# Patient Record
Sex: Male | Born: 1955 | Hispanic: No | Marital: Married | Smoking: Never smoker
Health system: Southern US, Community
[De-identification: ages and names within clinical notes are randomized; demographics above are authoritative.]

## PROBLEM LIST (undated history)

## (undated) DIAGNOSIS — I370 Nonrheumatic pulmonary valve stenosis: Secondary | ICD-10-CM

## (undated) DIAGNOSIS — I10 Essential (primary) hypertension: Secondary | ICD-10-CM

## (undated) DIAGNOSIS — R0789 Other chest pain: Secondary | ICD-10-CM

## (undated) DIAGNOSIS — Q210 Ventricular septal defect: Secondary | ICD-10-CM

## (undated) HISTORY — DX: Essential (primary) hypertension: I10

## (undated) HISTORY — DX: Ventricular septal defect: Q21.0

## (undated) HISTORY — DX: Nonrheumatic pulmonary valve stenosis: I37.0

## (undated) HISTORY — DX: Other chest pain: R07.89

---

## 2010-08-01 ENCOUNTER — Encounter: Payer: Self-pay | Admitting: Gastroenterology

## 2010-08-25 ENCOUNTER — Encounter (INDEPENDENT_AMBULATORY_CARE_PROVIDER_SITE_OTHER): Payer: Self-pay | Admitting: *Deleted

## 2010-08-29 ENCOUNTER — Ambulatory Visit: Payer: Self-pay | Admitting: Gastroenterology

## 2010-09-11 ENCOUNTER — Ambulatory Visit: Payer: Self-pay | Admitting: Gastroenterology

## 2011-01-23 NOTE — Letter (Signed)
Summary: [facility] Instructions  [facility]  [address] [place], [place] [address]   Phone: [phone]  Fax: [phone]       Roy Moyer    1956-08-02    MRN: [id number]        Procedure Day /Date:  Monday   09-11-10     Arrival Time: 7:30 a.m.     Procedure Time: 8:30 a.m.     Location of Procedure:                    _x_  [facility] (4th Floor)                       PREPARATION FOR COLONOSCOPY WITH MOVIPREP   Starting 5 days prior to your procedure  09-06-10 do not eat nuts, seeds, popcorn, corn, beans, peas,  salads, or any raw vegetables.  Do not take any fiber supplements (e.g. Metamucil, Citrucel, and Benefiber).  THE DAY BEFORE YOUR PROCEDURE         DATE:  09-10-10  DAY: Sunday  1.  Drink clear liquids the entire day-NO SOLID FOOD  2.  Do not drink anything colored red or purple.  Avoid juices with pulp.  No orange juice.  3.  Drink at least 64 oz. (8 glasses) of fluid/clear liquids during the day to prevent dehydration and help the prep work efficiently.  CLEAR LIQUIDS INCLUDE: Water Jello Ice Popsicles Tea (sugar ok, no milk/cream) Powdered fruit flavored drinks Coffee (sugar ok, no milk/cream) Gatorade Juice: apple, white grape, white cranberry  Lemonade Clear bullion, consomm, broth Carbonated beverages (any kind) Strained chicken noodle soup Hard Candy                             4.  In the morning, mix first dose of MoviPrep solution:    Empty 1 Pouch A and 1 Pouch B into the disposable container    Add lukewarm drinking water to the top line of the container. Mix to dissolve    Refrigerate (mixed solution should be used within 24 hrs)  5.  Begin drinking the prep at 5:00 p.m. The MoviPrep container is divided by 4 marks.   Every 15 minutes drink the solution down to the next mark (approximately 8 oz) until the full liter is complete.   6.  Follow completed prep with 16 oz of clear liquid of your choice  (Nothing red or purple).  Continue to drink clear liquids until bedtime.  7.  Before going to bed, mix second dose of MoviPrep solution:    Empty 1 Pouch A and 1 Pouch B into the disposable container    Add lukewarm drinking water to the top line of the container. Mix to dissolve    Refrigerate  THE DAY OF YOUR PROCEDURE      DATE:  09-11-10  DAY: Monday  Beginning at  3:30 a.m. (5 hours before procedure):         1. Every 15 minutes, drink the solution down to the next mark (approx 8 oz) until the full liter is complete.  2. Follow completed prep with 16 oz. of clear liquid of your choice.    3. You may drink clear liquids until  6:30 a.m. (2 HOURS BEFORE PROCEDURE).   MEDICATION INSTRUCTIONS  Unless otherwise instructed, you should take regular prescription medications with a small sip of water  as early as possible the morning of your procedure.        OTHER INSTRUCTIONS  You will need a responsible adult at least 53 years of age to accompany you and drive you home.   This person must remain in the waiting room during your procedure.  Wear loose fitting clothing that is easily removed.  Leave jewelry and other valuables at home.  However, you may wish to bring a book to read or  an iPod/MP3 player to listen to music as you wait for your procedure to start.  Remove all body piercing jewelry and leave at home.  Total time from sign-in until discharge is approximately 2-3 hours.  You should go home directly after your procedure and rest.  You can resume normal activities the  day after your procedure.  The day of your procedure you should not:   Drive   Make legal decisions   Operate machinery   Drink alcohol   Return to work  You will receive specific instructions about eating, activities and medications before you leave.    The above instructions have been reviewed and explained to me by   Wyona Almas RN  August 29, 2010 3:55 PM     I fully  understand and can verbalize these instructions _____________________________ Date _________

## 2011-01-23 NOTE — Procedures (Signed)
Summary: Colonoscopy  Patient: Roy Moyer Note: All result statuses are Final unless otherwise noted.  Tests: (1) Colonoscopy (COL)   COL Colonoscopy           DONE     [facility]     [address]     [place], [place]  [address]           COLONOSCOPY PROCEDURE REPORT           PATIENT:  [name], [name]  MR#:  [id number]     BIRTHDATE:  [birth year], 54 yrs. old  GENDER:  male     ENDOSCOPIST:  Vania Rea. Jarold Motto, MD, [facility]     REF. BY:  Talbot Grumbling. Creta Levin, M.D.     PROCEDURE DATE:  09/11/2010     PROCEDURE:  Average-risk screening colonoscopy     G0121     ASA CLASS:  Class I     INDICATIONS:  Routine Risk Screening     MEDICATIONS:   Fentanyl 100 mcg IV, Versed 10 mcg IV           DESCRIPTION OF PROCEDURE:   After the risks benefits and     alternatives of the procedure were thoroughly explained, informed     consent was obtained.  Digital rectal exam was performed and     revealed no abnormalities.   The LB CF-[id number] endoscope     was introduced through the anus and advanced to the cecum, which     was identified by both the appendix and ileocecal valve, without     limitations.  The quality of the prep was excellent, using     MoviPrep.  The instrument was then slowly withdrawn as the colon     was fully examined.     <<PROCEDUREIMAGES>>           FINDINGS:  Moderate diverticulosis was found throughout the colon.     No polyps or cancers were seen.  This was otherwise a normal     examination of the colon.   Retroflexed views in the rectum     revealed hypertrophied anal papillae.    The scope was then     withdrawn from the patient and the procedure completed.           COMPLICATIONS:  None     ENDOSCOPIC IMPRESSION:     1) Moderate diverticulosis throughout the colon     2) No polyps or cancers     3) Otherwise normal examination     4) Hypertrophied anal papillae     RECOMMENDATIONS:     1) high fiber diet     2) Continue current colorectal  screening recommendations for     "routine risk" patients with a repeat colonoscopy in 10 years.     REPEAT EXAM:  No           ______________________________     Vania Rea. Jarold Motto, MD, [facility]           CC:           n.     eSIGNED:   Vania Rea. Patterson at 09/11/2010 08:55 AM           Tommie Ard, [id number]  Note: An exclamation mark (!) indicates a result that was not dispersed into the flowsheet. Document Creation Date: 09/11/2010 8:55 AM _______________________________________________________________________  (1) Order result status: Final Collection or observation date-time: 09/11/2010 08:50 Requested date-time:  Receipt date-time:  Reported date-time:  Referring Physician:   Ordering Physician: Sheryn Bison [phone]) Specimen Source:  Source: Launa Grill Order Number: [id number] Lab site:   Appended Document: Colonoscopy    Clinical Lists Changes  Observations: Added new observation of COLONNXTDUE: 08/2020 (09/11/2010 10:55)

## 2011-01-23 NOTE — Miscellaneous (Signed)
Summary: [facility] Previsit/prep  Clinical Lists Changes  Medications: Added new medication of MOVIPREP 100 GM  SOLR (PEG-KCL-NACL-NASULF-NA ASC-C) As per prep instructions. - Signed Rx of MOVIPREP 100 GM  SOLR (PEG-KCL-NACL-NASULF-NA ASC-C) As per prep instructions.;  #1 x 0;  Signed;  Entered by: Wyona Almas RN;  Authorized by: Mardella Layman MD [facility];  Method used: Print then Give to Patient Observations: Added new observation of NKA: T (08/29/2010 15:31)    Prescriptions: MOVIPREP 100 GM  SOLR (PEG-KCL-NACL-NASULF-NA ASC-C) As per prep instructions.  #1 x 0   Entered by:   Wyona Almas RN   Authorized by:   Mardella Layman MD [facility]   Signed by:   Wyona Almas RN on 08/29/2010   Method used:   Print then Give to Patient   RxID:   [id number]

## 2011-01-23 NOTE — Letter (Signed)
Summary: Previsit letter  [facility]  [address] [place], [place] [address]   Phone: [phone]  Fax: [phone]       08/01/2010 MRN: [id number]  Roy Moyer [address] [place], [place]  [address]  Dear [name],  Welcome to the Gastroenterology Division at [facility].    You are scheduled to see a nurse for your pre-procedure visit on 08-29-10 at 3:30pm on the 3rd floor at [facility], [address]  We ask that you try to arrive at our office 15 minutes prior to your appointment time to allow for check-in.  Your nurse visit will consist of discussing your medical and surgical history, your immediate family medical history, and your medications.    Please bring a complete list of all your medications or, if you prefer, bring the medication bottles and we will list them.  We will need to be aware of both prescribed and over the counter drugs.  We will need to know exact dosage information as well.  If you are on blood thinners (Coumadin, Plavix, Aggrenox, Ticlid, etc.) please call our office today/prior to your appointment, as we need to consult with your physician about holding your medication.   Please be prepared to read and sign documents such as consent forms, a financial agreement, and acknowledgement forms.  If necessary, and with your consent, a friend or relative is welcome to sit-in on the nurse visit with you.  Please bring your insurance card so that we may make a copy of it.  If your insurance requires a referral to see a specialist, please bring your referral form from your primary care physician.  No co-pay is required for this nurse visit.     If you cannot keep your appointment, please call [phone] to cancel or reschedule prior to your appointment date.  This allows [place] the opportunity to schedule an appointment for another patient in need of care.    Thank you for choosing [facility] for your medical needs.  We  appreciate the opportunity to care for you.  Please visit [place] at our website  to learn more about our practice.                     Sincerely.                                                                                                                   The Gastroenterology Division

## 2011-09-07 ENCOUNTER — Encounter: Payer: Self-pay | Admitting: *Deleted

## 2011-09-07 DIAGNOSIS — IMO0002 Reserved for concepts with insufficient information to code with codable children: Secondary | ICD-10-CM | POA: Insufficient documentation

## 2011-09-07 DIAGNOSIS — S0180XA Unspecified open wound of other part of head, initial encounter: Secondary | ICD-10-CM | POA: Insufficient documentation

## 2011-09-07 DIAGNOSIS — Y92838 Other recreation area as the place of occurrence of the external cause: Secondary | ICD-10-CM | POA: Insufficient documentation

## 2011-09-07 DIAGNOSIS — Y9239 Other specified sports and athletic area as the place of occurrence of the external cause: Secondary | ICD-10-CM | POA: Insufficient documentation

## 2011-09-07 DIAGNOSIS — Y9317 Activity, water skiing and wake boarding: Secondary | ICD-10-CM | POA: Insufficient documentation

## 2011-09-07 DIAGNOSIS — I10 Essential (primary) hypertension: Secondary | ICD-10-CM | POA: Insufficient documentation

## 2011-09-07 NOTE — ED Notes (Signed)
Wake boarding and hit his face on the board. Laceration to his chin. Teeth intact.

## 2011-09-08 ENCOUNTER — Emergency Department (HOSPITAL_BASED_OUTPATIENT_CLINIC_OR_DEPARTMENT_OTHER)
Admission: EM | Admit: 2011-09-08 | Discharge: 2011-09-08 | Disposition: A | Discharge status: Home / Self Care | Payer: Managed Care, Other (non HMO) | Attending: Emergency Medicine | Admitting: Emergency Medicine

## 2011-09-08 DIAGNOSIS — S0181XA Laceration without foreign body of other part of head, initial encounter: Secondary | ICD-10-CM

## 2011-09-08 HISTORY — DX: Essential (primary) hypertension: I10

## 2011-09-08 MED ORDER — CEPHALEXIN 500 MG PO CAPS: 500.0000 mg | ORAL_CAPSULE | Freq: Four times a day (QID) | ORAL | Status: AC

## 2011-09-08 MED ORDER — TETANUS-DIPHTH-ACELL PERTUSSIS 5-2.5-18.5 LF-MCG/0.5 IM SUSP: 0.5000 mL | Freq: Once | INTRAMUSCULAR | Status: DC

## 2011-09-08 MED ORDER — IBUPROFEN 800 MG PO TABS: 800.0000 mg | ORAL_TABLET | Freq: Three times a day (TID) | ORAL | Status: AC

## 2011-09-08 MED ORDER — BACITRACIN ZINC 500 UNIT/GM EX OINT
TOPICAL_OINTMENT | Freq: Once | CUTANEOUS | Status: AC
  Administered 2011-09-08: 02:00:00 via TOPICAL
  Filled 2011-09-08: qty 15

## 2011-09-08 NOTE — ED Provider Notes (Signed)
History     CSN: [id number] Arrival date & time: 09/08/2011 12:27 AM   Chief Complaint  Patient presents with  . Facial Laceration     (Include location/radiation/quality/duration/timing/severity/associated sxs/prior treatment) Patient is a 55 y.o. male presenting with skin laceration. The history is provided by the patient.  Laceration  The incident occurred 3 to 5 hours ago. The laceration is located on the face. The laceration is 4 cm in size. Injury mechanism: wake boarding and board struck him in the face, no LOC or neck pain, small chip to L lat incisor - no loose teeth. The pain is moderate. The pain has been constant since onset. He reports no foreign bodies present. His tetanus status is UTD.     Past Medical History  Diagnosis Date  . Hypertension      History reviewed. No pertinent past surgical history.  No family history on file.  History  Substance Use Topics  . Smoking status: Never Smoker   . Smokeless tobacco: Not on file  . Alcohol Use: Yes      Review of Systems  Constitutional: Negative for fever and chills.  HENT: Negative for facial swelling, neck pain and neck stiffness.   Eyes: Negative for pain.  Respiratory: Negative for shortness of breath.   Cardiovascular: Negative for chest pain and leg swelling.  Gastrointestinal: Negative for abdominal pain.  Genitourinary: Negative for dysuria.  Musculoskeletal: Negative for back pain.  Skin: Positive for wound. Negative for rash.  Neurological: Negative for headaches.  All other systems reviewed and are negative.    Allergies  Review of patient's allergies indicates not on file.  Home Medications  No current outpatient prescriptions on file.  Physical Exam    BP 120/62  Pulse 84  Temp(Src) 98.1 F (36.7 C) (Oral)  Resp 20  SpO2 98%  Physical Exam  Constitutional: He is oriented to person, place, and time. He appears well-developed and well-nourished.  HENT:  Head: Normocephalic.         1cm inner lowwer lip lac no lower dental trauma, very samll ellis I fx to L upper lat incisor no loose or tender dentition. There is associated chim lac Y shaped 3cm thru and thru full thickness lac, no bony deformity  Eyes: Conjunctivae and EOM are normal. Pupils are equal, round, and reactive to light.  Neck: Normal range of motion and full passive range of motion without pain. Neck supple. No thyromegaly present.       No midline tenderness or deformity  Cardiovascular: Normal rate, regular rhythm, S1 normal, S2 normal and intact distal pulses.   Pulmonary/Chest: Effort normal and breath sounds normal.  Abdominal: Soft. Bowel sounds are normal. There is no tenderness. There is no CVA tenderness.  Musculoskeletal: Normal range of motion.  Neurological: He is alert and oriented to person, place, and time. He has normal strength and normal reflexes. No cranial nerve deficit or sensory deficit. He displays a negative Romberg sign. GCS eye subscore is 4. GCS verbal subscore is 5. GCS motor subscore is 6.       Normal Gait  Skin: Skin is warm and dry. No rash noted. No cyanosis. Nails show no clubbing.  Psychiatric: He has a normal mood and affect. His speech is normal and behavior is normal.    ED Course  LACERATION REPAIR Date/Time: 09/08/2011 1:58 AM Performed by: Sunnie Nielsen Authorized by: Sunnie Nielsen Consent: Verbal consent obtained. Risks and benefits: risks, benefits and alternatives were discussed Consent given by:  patient Patient understanding: patient states understanding of the procedure being performed Patient consent: the patient's understanding of the procedure matches consent given Procedure consent: procedure consent matches procedure scheduled Patient identity confirmed: verbally with patient Time out: Immediately prior to procedure a "time out" was called to verify the correct patient, procedure, equipment, support staff and site/side marked as required. Location:  chin and inner lower lip. Laceration length: 4 cm Foreign bodies: no foreign bodies Tendon involvement: none Nerve involvement: none Vascular damage: no Anesthesia: local infiltration Local anesthetic: lidocaine 1% without epinephrine Anesthetic total: 3 ml Preparation: Patient was prepped and draped in the usual sterile fashion. Irrigation solution: saline Irrigation method: syringe Amount of cleaning: extensive Debridement: none Degree of undermining: none Skin closure: 6-0 Prolene Subcutaneous closure: 5-0 Vicryl Number of sutures: 5 Technique: simple Approximation: close Approximation difficulty: complex Dressing: 4x4 sterile gauze and antibiotic ointment Patient tolerance: Patient tolerated the procedure well with no immediate complications.    No results found for this or any previous visit. No results found.   No diagnosis found.   MDM  Facial lac thru and thru with minor dental trauma. No LOC or neck pain/ defictis. RX, tetanus and SR 5 days       Sunnie Nielsen, MD 09/08/11 0200

## 2014-12-08 ENCOUNTER — Other Ambulatory Visit: Payer: Self-pay | Admitting: Family Medicine

## 2014-12-08 DIAGNOSIS — K4090 Unilateral inguinal hernia, without obstruction or gangrene, not specified as recurrent: Secondary | ICD-10-CM

## 2014-12-10 ENCOUNTER — Ambulatory Visit
Admission: RE | Admit: 2014-12-10 | Discharge: 2014-12-10 | Disposition: A | Discharge status: Home / Self Care | Payer: Managed Care, Other (non HMO) | Source: Ambulatory Visit | Attending: Family Medicine | Admitting: Family Medicine

## 2014-12-10 DIAGNOSIS — K4090 Unilateral inguinal hernia, without obstruction or gangrene, not specified as recurrent: Secondary | ICD-10-CM

## 2014-12-28 ENCOUNTER — Ambulatory Visit (INDEPENDENT_AMBULATORY_CARE_PROVIDER_SITE_OTHER): Payer: Self-pay | Admitting: Surgery

## 2014-12-28 NOTE — H&P (Signed)
Roy Moyer 12/28/2014 1:53 PM Location: [facility] Patient #: [id number] DOB: 11/13/1956 Married / Language: [place] / Race: White Male History of Present Illness Roy Moyer Fus A. Kaily Wragg MD; 12/28/2014 2:29 PM) Patient words: RIH  pt sent at the request of Dr Doristine Counter for right inguinal hernia. He has has swelling for years right goin with mild discomfort with activity but better with rest. He has had a hernia on the left repaired before.  The patient is a 59 year old male who presents with inguinal swelling. The last clinic visit was 12 month(s) ago. Symptoms include inguinal swelling and inguinal pain. Onset was gradual. The patient describes this as worsening. Other Problems [name], CMA; 12/28/2014 1:53 PM) Heart murmur High blood pressure Hypercholesterolemia Inguinal Hernia  Past Surgical History [name], CMA; 12/28/2014 1:53 PM) Open Inguinal Hernia Surgery Left.  Diagnostic Studies History [name], CMA; 12/28/2014 1:53 PM) Colonoscopy 1-5 years ago  Allergies [name], CMA; 12/28/2014 1:54 PM) No Known Drug Allergies 12/28/2014  Medication History [name], CMA; 12/28/2014 1:54 PM) Lipitor (  Tablet, Oral) Active.  Social History [name], [place]; 12/28/2014 1:53 PM) Alcohol use Occasional alcohol use. Caffeine use Coffee, Tea. No drug use Tobacco use Never smoker.  Family History [name], [place]; 12/28/2014 1:53 PM) Hypertension Mother.     Review of Systems [name] CMA; 12/28/2014 1:53 PM) General Not Present- Appetite Loss, Chills, Fatigue, Fever, Night Sweats, Weight Gain and Weight Loss. Skin Not Present- Change in Wart/Mole, Dryness, Hives, Jaundice, New Lesions, Non-Healing Wounds, Rash and Ulcer. HEENT Present- Ringing in the Ears and Wears glasses/contact lenses. Not Present- Earache, Hearing Loss, Hoarseness, Nose Bleed, Oral Ulcers, Seasonal Allergies, Sinus Pain, Sore Throat, Visual  Disturbances and Yellow Eyes. Respiratory Present- Snoring. Not Present- Bloody sputum, Chronic Cough, Difficulty Breathing and Wheezing. Breast Not Present- Breast Mass, Breast Pain, Nipple Discharge and Skin Changes. Cardiovascular Not Present- Chest Pain, Difficulty Breathing Lying Down, Leg Cramps, Palpitations, Rapid Heart Rate, Shortness of Breath and Swelling of Extremities. Gastrointestinal Not Present- Abdominal Pain, Bloating, Bloody Stool, Change in Bowel Habits, Chronic diarrhea, Constipation, Difficulty Swallowing, Excessive gas, Gets full quickly at meals, Hemorrhoids, Indigestion, Nausea, Rectal Pain and Vomiting. Male Genitourinary Not Present- Blood in Urine, Change in Urinary Stream, Frequency, Impotence, Nocturia, Painful Urination, Urgency and Urine Leakage. Musculoskeletal Not Present- Back Pain, Joint Pain, Joint Stiffness, Muscle Pain, Muscle Weakness and Swelling of Extremities. Neurological Not Present- Decreased Memory, Fainting, Headaches, Numbness, Seizures, Tingling, Tremor, Trouble walking and Weakness. Psychiatric Not Present- Anxiety, Bipolar, Change in Sleep Pattern, Depression, Fearful and Frequent crying. Endocrine Not Present- Cold Intolerance, Excessive Hunger, Hair Changes, Heat Intolerance, Hot flashes and New Diabetes. Hematology Not Present- Easy Bruising, Excessive bleeding, Gland problems, HIV and Persistent Infections.  Vitals [name] CMA; 12/28/2014 1:55 PM) 12/28/2014 1:55 PM Weight: 156 lb Height: 66in Body Surface Area: 1.82 m Body Mass Index: 25.18 kg/m Temp.: 98.58F  Pulse: 74 (Regular)  BP: 140/90 (Sitting, Left Arm, Standard)     Physical Exam ([name] MD; 12/28/2014 2:29 PM)  General Mental Status-Alert. General Appearance-Consistent with stated age. Hydration-Well hydrated. Voice-Normal.  Head and Neck Head-normocephalic, atraumatic with no lesions or palpable  masses. Trachea-midline.  Eye Eyeball - Bilateral-Extraocular movements intact. Sclera/Conjunctiva - Bilateral-No scleral icterus.  Chest and Lung Exam Chest and lung exam reveals -quiet, even and easy respiratory effort with no use of accessory muscles and on auscultation, normal breath sounds, no adventitious sounds and normal vocal resonance. Inspection Chest Wall -  Normal. Back - normal.  Cardiovascular Cardiovascular examination reveals -normal heart sounds, regular rate and rhythm with no murmurs and normal pedal pulses bilaterally.  Abdomen Inspection Skin - Scar - no surgical scars. Hernias - Ventral - Reducible. Inguinal hernia - Left - Note: scar no recurrent hernia. Inguinal hernia - Right - Reducible. Palpation/Percussion Palpation and Percussion of the abdomen reveal - Soft, Non Tender, No Rebound tenderness, No Rigidity (guarding) and No hepatosplenomegaly. Auscultation Auscultation of the abdomen reveals - Bowel sounds normal.  Neurologic Neurologic evaluation reveals -alert and oriented x 3 with no impairment of recent or remote memory. Mental Status-Normal.  Musculoskeletal Normal Exam - Left-Upper Extremity Strength Normal and Lower Extremity Strength Normal. Normal Exam - Right-Upper Extremity Strength Normal, Lower Extremity Weakness.    Assessment & Plan ([name] MD; 12/28/2014 2:25 PM)  RIGHT INGUINAL HERNIA (550.90  K40.90) Impression: pt desires repair. open and laparoscopic discussed with mesh. pt decided on open right inguinal hernia repair with mesh. The risk of hernia repair include bleeding, infection, organ injury, bowel injury, bladder injury, nerve injury recurrent hernia, blood clots, worsening of underlying condition, chronic pain, mesh use, open surgery, death, and the need for other operattions. Pt agrees to proceed  Current Plans Pt Education - [facility] Umbilical/ Inguinal Hernia HCI

## 2015-05-25 ENCOUNTER — Encounter: Payer: Self-pay | Admitting: Gastroenterology

## 2016-05-28 DIAGNOSIS — E785 Hyperlipidemia, unspecified: Secondary | ICD-10-CM | POA: Insufficient documentation

## 2016-06-14 ENCOUNTER — Ambulatory Visit (INDEPENDENT_AMBULATORY_CARE_PROVIDER_SITE_OTHER): Payer: Managed Care, Other (non HMO) | Admitting: Cardiovascular Disease

## 2016-06-14 ENCOUNTER — Encounter: Payer: Self-pay | Admitting: Cardiovascular Disease

## 2016-06-14 VITALS — BP 131/78 | HR 75 | Ht 67.0 in | Wt 164.8 lb

## 2016-06-14 DIAGNOSIS — E785 Hyperlipidemia, unspecified: Secondary | ICD-10-CM | POA: Diagnosis not present

## 2016-06-14 DIAGNOSIS — I370 Nonrheumatic pulmonary valve stenosis: Secondary | ICD-10-CM

## 2016-06-14 DIAGNOSIS — Q210 Ventricular septal defect: Secondary | ICD-10-CM | POA: Diagnosis not present

## 2016-06-14 NOTE — Patient Instructions (Addendum)
Medication Instructions:  Your physician recommends that you continue on your current medications as directed. Please refer to the Current Medication list given to you today.  Labwork: NONE  Testing/Procedures: Your physician has requested that you have an echocardiogram. Echocardiography is a painless test that uses sound waves to create images of your heart. It provides your doctor with information about the size and shape of your heart and how well your heart's chambers and valves are working. This procedure takes approximately one hour. There are no restrictions for this procedure. [facility] [address]  Follow-Up: Your physician recommends that you schedule a follow-up appointment in: 2-4 WEEK FOLLOW UP (AFTER ECHO  If you need a refill on your cardiac medications before your next appointment, please call your pharmacy.

## 2016-06-14 NOTE — Progress Notes (Signed)
Cardiology Office Note   Date:  06/14/2016   ID:  Roy Moyer, DOB 14-Nov-1956, MRN [id number]  PCP:  Lilia ArgueKAPLAN,KRISTEN, PA-C  Cardiologist:   Chilton Siiffany [place], MD   Chief Complaint  Patient presents with  . New Patient (Initial Visit)    Pt states no Sx      History of Present Illness: Roy Moyer is a 60 y.o. male with hypertension, pulmonary stenosis, and hyperlipidemia who presents for evaluation of a murmur.  Roy Moyer reports being diagnosed with pulmonary stenosis as an infant.  He thinks that he also has a left to right shunt.  He had a heart catheterization but never required any surgery or interventions.  He last had an echo four years ago and was told that he no longer had pulmonary stenosis.  Roy Moyer saw Mady GemmaKristen Kaplan, Cordelia PochePA-C, of [facility] on 05/28/16.  At that appointment he was noted to have a loud murmur and was referred to cardiology for evaluation.    Roy Moyer has been feeling well.  He denies chest pain, shortness of breath, lower extremity edema, orthopnea or PND.  He exercises for 30 minute 3 times per week doing resistance training and walking.  He notes that his blood pressure is intermittently elevated.  Four years ago he was treated with Benicar but he became hypotensive. He reduced the dose and eventually stopped it.  The highest it has been at home lately is 140/90.  He was prescribed atorvastatin four years ago.  He is now taking 5mg  instead of the 10mg  prescribed and he does not take it regularly.  He has been working to improve his diet.  Past Medical History  Diagnosis Date  . Hypertension     No past surgical history on file.   Current Outpatient Prescriptions  Medication Sig Dispense Refill  . atorvastatin (LIPITOR) 10 MG tablet Take 5 mg by mouth daily.     No current facility-administered medications for this visit.    Allergies:   Review of patient's allergies indicates not on file.    Social History:  The patient   reports that he has never smoked. He does not have any smokeless tobacco history on file. He reports that he drinks alcohol. He reports that he does not use illicit drugs.   Family History:  The patient's family history is not on file.    ROS:  Please see the history of present illness.   Otherwise, review of systems are positive for none.   All other systems are reviewed and negative.    PHYSICAL EXAM: VS:  BP 131/78 mmHg  Pulse 75  Ht 5\' 7"  (1.702 m)  Wt 164 lb 12.8 oz (74.753 kg)  BMI 25.81 kg/m2 , BMI Body mass index is 25.81 kg/(m^2). GENERAL:  Well appearing HEENT:  Pupils equal round and reactive, fundi not visualized, oral mucosa unremarkable NECK:  No jugular venous distention, waveform within normal limits, carotid upstroke brisk and symmetric, no bruits, no thyromegaly LYMPHATICS:  No cervical adenopathy LUNGS:  Clear to auscultation bilaterally HEART:  RRR.  PMI not displaced or sustained,S1 and S2 within normal limits, no S3, no S4, no clicks, no rubs, III/VI mid-peaking systolic murmur at the LUSB.  IV/VI harsh, systolic murmur at the LLSB. ABD:  Flat, positive bowel sounds normal in frequency in pitch, no bruits, no rebound, no guarding, no midline pulsatile mass, no hepatomegaly, no splenomegaly EXT:  2 plus pulses throughout, no edema, no cyanosis no clubbing SKIN:  No rashes no nodules NEURO:  Cranial nerves II through XII grossly intact, motor grossly intact throughout PSYCH:  Cognitively intact, oriented to person place and time    EKG:  EKG is not ordered today. The ekg ordered 319/14 demonstrates sinus rhythm rate 72 bpm.     Recent Labs: No results found for requested labs within last 365 days.    Lipid Panel No results found for: CHOL, TRIG, HDL, CHOLHDL, VLDL, LDLCALC, LDLDIRECT   6/5/786/2/17:  TSH 2.29  Total cholesterol 198, triglycerides 80, HDL 55, LDL 119 Sodium 143, potassium 4.3, BUN 15, creatinine 0.85 AST 19, ALT 28 WBC 6.6, Hgb 14.6, Hct  42.7, platelets 207   Wt Readings from Last 3 Encounters:  06/14/16 164 lb 12.8 oz (74.753 kg)      ASSESSMENT AND PLAN:  # Pulmonary Stenosis: # VSD:  Roy Moyer has murmurs consistent with both pulmonary stenosis and a VSD.  He is stable clinically and does not have any evidence of left or right heart failure.  We will obtain an echo to evaluate.  I'm especially interested in the size and function of the right ventricle.  Given how loud his VSD murmur is, the whole is likely very small.   # Hypertension: BP is well-controlled.  Continue to monitor for now.  # Hyperlipidemia: Lipids are elevated and he is only taking the prescribed dose.  We discussed the importance of taking it regularly.  He expressed understanding.   Current medicines are reviewed at length with the patient today.  The patient does not have concerns regarding medicines.  The following changes have been made:  no change  Labs/ tests ordered today include:  No orders of the defined types were placed in this encounter.     Disposition:   FU with Delma Drone C. Duke Salviaandolph, MD, [facility] in 2-4 weeks   This note was written with the assistance of speech recognition software.  Please excuse any transcriptional errors.  Signed, Crislyn Willbanks C. Duke Salviaandolph, MD, [facility]  06/14/2016 12:17 PM    [facility]

## 2016-06-17 ENCOUNTER — Encounter: Payer: Self-pay | Admitting: Cardiovascular Disease

## 2016-06-17 DIAGNOSIS — Q210 Ventricular septal defect: Secondary | ICD-10-CM | POA: Insufficient documentation

## 2016-06-17 DIAGNOSIS — I370 Nonrheumatic pulmonary valve stenosis: Secondary | ICD-10-CM

## 2016-06-17 HISTORY — DX: Nonrheumatic pulmonary valve stenosis: I37.0

## 2016-06-17 HISTORY — DX: Ventricular septal defect: Q21.0

## 2016-07-03 ENCOUNTER — Other Ambulatory Visit: Payer: Self-pay

## 2016-07-03 ENCOUNTER — Ambulatory Visit (HOSPITAL_COMMUNITY): Payer: Managed Care, Other (non HMO) | Attending: Cardiology

## 2016-07-03 DIAGNOSIS — Q210 Ventricular septal defect: Secondary | ICD-10-CM | POA: Insufficient documentation

## 2016-07-03 DIAGNOSIS — I119 Hypertensive heart disease without heart failure: Secondary | ICD-10-CM | POA: Diagnosis not present

## 2016-07-03 DIAGNOSIS — I370 Nonrheumatic pulmonary valve stenosis: Secondary | ICD-10-CM | POA: Diagnosis present

## 2016-07-09 ENCOUNTER — Telehealth: Payer: Self-pay | Admitting: Cardiovascular Disease

## 2016-07-09 NOTE — Telephone Encounter (Signed)
Follow-up     The pt is returning the nurses call from Friday

## 2016-07-09 NOTE — Telephone Encounter (Signed)
Left message to call back  

## 2016-07-09 NOTE — Telephone Encounter (Signed)
-----   Message from Chilton Siiffany Oakesdale, MD sent at 07/06/2016 10:19 AM EDT ----- Echo shows a small VSD, or hole in the wall between the left and right ventricle. His echo is stable and no changes recommended at this time.

## 2016-07-10 NOTE — Telephone Encounter (Signed)
Advised patient

## 2016-07-10 NOTE — Telephone Encounter (Signed)
F/U ° ° °Pt returning nurse call. Please call. °

## 2016-07-11 NOTE — Progress Notes (Signed)
Cardiology Office Note   Date:  07/12/2016   ID:  Roy Moyer, DOB 1956-02-07, MRN [id number]  PCP:  Lilia Argue  Cardiologist:   Chilton Si, MD   Chief Complaint  Patient presents with  . Follow-up      History of Present Illness: Roy Moyer is a 60 y.o. male with hypertension, pulmonary stenosis, and hyperlipidemia who presents for evaluation of a murmur.  Roy Moyer reports being diagnosed with pulmonary stenosis as an infant.  He thinks that he also has a left to right shunt.  He had a heart catheterization but never required any surgery or interventions.  He last had an echo four years ago and was told that he no longer had pulmonary stenosis.  He had an echo 06/2016 that revealed a small VSD with normal systolic function and left ventricular size.  Roy Moyer continues to do well.  He denies chest pain or shortness of breath.  He also has not noted any lower extremity edema, orthopnea or PND.  He hasn't been doing much cardio but does weight training regularly and has no limitations.  He rarely checks his BP at home.  It is typically in the 130s/70s.    Past Medical History  Diagnosis Date  . Hypertension   . Pulmonary stenosis 06/17/2016  . VSD (ventricular septal defect) 06/17/2016    No past surgical history on file.   Current Outpatient Prescriptions  Medication Sig Dispense Refill  . atorvastatin (LIPITOR) 10 MG tablet Take 5 mg by mouth daily.     No current facility-administered medications for this visit.    Allergies:   Review of patient's allergies indicates not on file.    Social History:  The patient  reports that he has never smoked. He does not have any smokeless tobacco history on file. He reports that he drinks alcohol. He reports that he does not use illicit drugs.   Family History:  The patient's family history includes Hypertension in his father and mother.    ROS:  Please see the history of present illness.   Otherwise,  review of systems are positive for none.   All other systems are reviewed and negative.    PHYSICAL EXAM: VS:  BP 130/76 mmHg  Pulse 58  Ht  (1.702 m)  Wt 165 lb (74.844 kg)  BMI 25.84 kg/m2  SpO2 99% , BMI Body mass index is 25.84 kg/(m^2). GENERAL:  Well appearing HEENT:  Pupils equal round and reactive, fundi not visualized, oral mucosa unremarkable NECK:  No jugular venous distention, waveform within normal limits, carotid upstroke brisk and symmetric, no bruits, no thyromegaly LYMPHATICS:  No cervical adenopathy LUNGS:  Clear to auscultation bilaterally HEART:  RRR.  PMI not displaced or sustained,S1 and S2 within normal limits, no S3, no S4, no clicks, no rubs, IV/VI harsh, systolic murmur at the LLSB. ABD:  Flat, positive bowel sounds normal in frequency in pitch, no bruits, no rebound, no guarding, no midline pulsatile mass, no hepatomegaly, no splenomegaly EXT:  2 plus pulses throughout, no edema, no cyanosis no clubbing SKIN:  No rashes no nodules NEURO:  Cranial nerves II through XII grossly intact, motor grossly intact throughout PSYCH:  Cognitively intact, oriented to person place and time   EKG:  EKG is not ordered today. The ekg ordered 319/14 demonstrates sinus rhythm rate 72 bpm.    Echo 07/03/16: History: PMH: Hyperlipidemia. Risk factors: Hypertension.  ------------------------------------------------------------------- Study Conclusions  - Left ventricle: The cavity size  was normal. Wall thickness was  normal. Systolic function was normal. The estimated ejection  fraction was in the range of 60% to 65%. Wall motion was normal;  there were no regional wall motion abnormalities. - Right atrium: The atrium was mildly dilated.  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  There is no pulmonic stenosis .   Recent Labs: No results found for requested labs within last 365 days.      Lipid Panel No results found for: CHOL, TRIG, HDL, CHOLHDL, VLDL, LDLCALC, LDLDIRECT   0/9/816/2/17:  TSH 2.29  Total cholesterol 198, triglycerides 80, HDL 55, LDL 119 Sodium 143, potassium 4.3, BUN 15, creatinine 0.85 AST 19, ALT 28 WBC 6.6, Hgb 14.6, Hct 42.7, platelets 207   Wt Readings from Last 3 Encounters:  07/12/16 165 lb (74.844 kg)  06/14/16 164 lb 12.8 oz (74.753 kg)      ASSESSMENT AND PLAN:  # Pulmonary Stenosis: # VSD:  Roy Moyer did not have any pulmonary stenosis on his last two echos.  He never required intervention and this likely resolved as he got older.  He still has a small VSD but there is no hemodynamic consequence at this time.  There was no evidence of LV overload or systolic dysfunction.  Endocarditis prophylaxis is not indicated.  # Hypertension: BP is well-controlled.  He was previously on losartan and developed hypotension.  Continue to monitor for now.  # Hyperlipidemia: Lipids are elevated and he is only taking half the prescribed dose on the week days and none on the weekend.  We discussed the importance of compliance and recommended increased cardio.  Current medicines are reviewed at length with the patient today.  The patient does not have concerns regarding medicines.  The following changes have been made:  no change  Labs/ tests ordered today include:  No orders of the defined types were placed in this encounter.     Disposition:   FU with Roy Hantz C. Duke Salviaandolph, MD, [facility] in 1 year.   This note was written with the assistance of speech recognition software.  Please excuse any transcriptional errors.  Signed, Yula Crotwell C. Duke Salviaandolph, MD, [facility]  07/12/2016 7:59 AM    [facility]

## 2016-07-12 ENCOUNTER — Ambulatory Visit (INDEPENDENT_AMBULATORY_CARE_PROVIDER_SITE_OTHER): Payer: Managed Care, Other (non HMO) | Admitting: Cardiovascular Disease

## 2016-07-12 ENCOUNTER — Encounter: Payer: Self-pay | Admitting: Cardiovascular Disease

## 2016-07-12 VITALS — BP 130/76 | HR 58 | Ht 67.0 in | Wt 165.0 lb

## 2016-07-12 DIAGNOSIS — Q210 Ventricular septal defect: Secondary | ICD-10-CM

## 2016-07-12 DIAGNOSIS — E785 Hyperlipidemia, unspecified: Secondary | ICD-10-CM

## 2016-07-12 NOTE — Patient Instructions (Signed)
Your physician wants you to follow-up in: 1 Year. You will receive a reminder letter in the mail two months in advance. If you don't receive a letter, please call our office to schedule the follow-up appointment.  

## 2016-07-23 ENCOUNTER — Ambulatory Visit: Payer: Managed Care, Other (non HMO) | Admitting: Cardiovascular Disease

## 2017-07-23 ENCOUNTER — Ambulatory Visit: Payer: Managed Care, Other (non HMO) | Admitting: Cardiovascular Disease

## 2017-11-06 ENCOUNTER — Telehealth: Payer: Self-pay | Admitting: *Deleted

## 2017-11-06 NOTE — Telephone Encounter (Signed)
Appointment request notes faxed to [facility]. Geselle Cardosa, CMA 

## 2017-12-11 ENCOUNTER — Encounter: Payer: Self-pay | Admitting: Cardiovascular Disease

## 2017-12-11 ENCOUNTER — Ambulatory Visit (INDEPENDENT_AMBULATORY_CARE_PROVIDER_SITE_OTHER): Payer: Commercial Managed Care - PPO | Admitting: Cardiovascular Disease

## 2017-12-11 VITALS — BP 148/78 | HR 64 | Ht 67.0 in | Wt 152.4 lb

## 2017-12-11 DIAGNOSIS — Q210 Ventricular septal defect: Secondary | ICD-10-CM | POA: Diagnosis not present

## 2017-12-11 DIAGNOSIS — I10 Essential (primary) hypertension: Secondary | ICD-10-CM

## 2017-12-11 DIAGNOSIS — E7800 Pure hypercholesterolemia, unspecified: Secondary | ICD-10-CM

## 2017-12-11 NOTE — Patient Instructions (Signed)
Medication Instructions:  Your physician recommends that you continue on your current medications as directed. Please refer to the Current Medication list given to you today.  Labwork: NONE  Testing/Procedures: NONE  Follow-Up: Your physician recommends that you schedule a follow-up appointment in: END OF February   Any Other Special Instructions Will Be Listed Below (If Applicable). MONITOR YOUR BLOOD PRESSURE AT HOME DAILY AND BRING WITH YOU TO YOUR FOLLOW UP   If you need a refill on your cardiac medications before your next appointment, please call your pharmacy.

## 2017-12-11 NOTE — Progress Notes (Signed)
Cardiology Office Note   Date:  12/11/2017   ID:  Tommie ArdBruce Horsey, DOB Feb 16, 1956, MRN [id number]  PCP:  Richmond CampbellKaplan, Kristen W., PA-C  Cardiologist:   Chilton Siiffany De Lamere, MD   No chief complaint on file.     History of Present Illness: Tommie ArdBruce Albea is a 61 y.o. male with a small VSD, hypertension, and hyperlipidemia who presents for evaluation of a murmur.  [name] reports being diagnosed with pulmonary stenosis as an infant.  He had a heart catheterization but never required any surgery or interventions.  He last had an echo four years ago and was told that he no longer had pulmonary stenosis.  He had an echo 06/2016 that revealed a small VSD with normal systolic function and left ventricular size.  There was no pulmonic stenosis.  At his last appointment he was doing well.  He was only taking his cholesterol medicine intermittently and was asked to take it more regularly due to elevated LDL.  Since that appointment he worked with his PCP and decided to stop taking the atorvastatin on a trial basis.  He is scheduled to have repeat lipids with his primary care provider in mid February.  He has been walking for 30-35 minutes daily.  He also does lots of yard work.  He has no exertional symptoms.  He has no lower extremity edema, orthopnea, or PND.  His blood pressure at home is usually around 130-135 over 80s.  He does not have much salt in his diet and eats most of his meals at home.   Past Medical History:  Diagnosis Date  . Hypertension   . Pulmonary stenosis 06/17/2016  . VSD (ventricular septal defect) 06/17/2016    History reviewed. No pertinent surgical history.   No current outpatient medications on file.   No current facility-administered medications for this visit.     Allergies:   Patient has no allergy information on record.    Social History:  The patient  reports that  has never smoked. he has never used smokeless tobacco. He reports that he drinks alcohol. He reports  that he does not use drugs.   Family History:  The patient's family history includes Hypertension in his father and mother.    ROS:  Please see the history of present illness.   Otherwise, review of systems are positive for none.   All other systems are reviewed and negative.    PHYSICAL EXAM: VS:  BP (!) 148/78   Pulse 64   Ht 5\' 7"  (1.702 m)   Wt 152 lb 6.4 oz (69.1 kg)   BMI 23.87 kg/m  , BMI Body mass index is 23.87 kg/m. GENERAL:  Well appearing HEENT: Pupils equal round and reactive, fundi not visualized, oral mucosa unremarkable NECK:  No jugular venous distention, waveform within normal limits, carotid upstroke brisk and symmetric, no bruits, no thyromegaly LYMPHATICS:  No cervical adenopathy LUNGS:  Clear to auscultation bilaterally HEART:  RRR.  PMI not displaced or sustained,S1 and S2 within normal limits, no S3, no S4, no clicks, no rubs, III/VI harsh systolic murmur at the lower sternal border ABD:  Flat, positive bowel sounds normal in frequency in pitch, no bruits, no rebound, no guarding, no midline pulsatile mass, no hepatomegaly, no splenomegaly EXT:  2 plus pulses throughout, no edema, no cyanosis no clubbing SKIN:  No rashes no nodules NEURO:  Cranial nerves II through XII grossly intact, motor grossly intact throughout PSYCH:  Cognitively intact, oriented to person place  and time    EKG:  EKG is not ordered today. The ekg ordered 319/14 demonstrates sinus rhythm rate 72 bpm.   12/11/17: Sinus rhythm.  Rate 64 bpm.    Echo 07/03/16: History: PMH: Hyperlipidemia. Risk factors: Hypertension.  ------------------------------------------------------------------- Study Conclusions  - Left ventricle: The cavity size was normal. Wall thickness was  normal. Systolic function was normal. The estimated ejection  fraction was in the range of 60% to 65%. Wall motion was normal;  there were no regional wall motion abnormalities. - Right atrium: The atrium  was mildly dilated.  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  There is no pulmonic stenosis .   Recent Labs: No results found for requested labs within last 8760 hours.    Lipid Panel No results found for: CHOL, TRIG, HDL, CHOLHDL, VLDL, LDLCALC, LDLDIRECT   0/9/816/2/17:  TSH 2.29  Total cholesterol 198, triglycerides 80, HDL 55, LDL 119 Sodium 143, potassium 4.3, BUN 15, creatinine 0.85 AST 19, ALT 28 WBC 6.6, Hgb 14.6, Hct 42.7, platelets 207   Wt Readings from Last 3 Encounters:  12/11/17 152 lb 6.4 oz (69.1 kg)  07/12/16 165 lb (74.8 kg)  06/14/16 164 lb 12.8 oz (74.8 kg)      ASSESSMENT AND PLAN:  # Pulmonary Stenosis: # VSD:  [name] did not have any pulmonary stenosis on his last two echos.  He never required intervention and this likely resolved as he got older.  He still has a small VSD but there is no hemodynamic consequence at this time.  There was no evidence of LV overload or systolic dysfunction.  Endocarditis prophylaxis is not indicated.  Stable.   # Hypertension: BP is borderline.  He was previously on losartan and developed hypotension.  He would like to try and increase his exercise.  He will keep a log of his blood pressures at home and bring that to follow-up in 2 months.  # Hyperlipidemia: Atorvastatin is currently on hold.  He is due for repeat lipids in February.  At that time we will recheck his ASCVD 10-year risk and reassess.  Current medicines are reviewed at length with the patient today.  The patient does not have concerns regarding medicines.  The following changes have been made:  no change  Labs/ tests ordered today include:  No orders of the defined types were placed in this encounter.    Disposition:   FU with Tino Ronan C. Duke Salviaandolph, MD, [facility] in 2 months.   This note was written with the assistance of speech recognition software.  Please excuse any transcriptional  errors.  Signed, Doren Kaspar C. Duke Salviaandolph, MD, [facility]  12/11/2017 9:57 AM    [facility]

## 2017-12-11 NOTE — Addendum Note (Signed)
Addended by: Chana BodeGREEN, Elizabethanne Lusher L on: 12/11/2017 10:06 AM   Modules accepted: Orders

## 2018-02-19 ENCOUNTER — Encounter: Payer: Self-pay | Admitting: Cardiovascular Disease

## 2018-02-19 ENCOUNTER — Ambulatory Visit (INDEPENDENT_AMBULATORY_CARE_PROVIDER_SITE_OTHER): Payer: Commercial Managed Care - PPO | Admitting: Cardiovascular Disease

## 2018-02-19 VITALS — BP 136/84 | HR 78 | Ht 67.0 in | Wt 154.8 lb

## 2018-02-19 DIAGNOSIS — Q210 Ventricular septal defect: Secondary | ICD-10-CM | POA: Diagnosis not present

## 2018-02-19 DIAGNOSIS — E7800 Pure hypercholesterolemia, unspecified: Secondary | ICD-10-CM

## 2018-02-19 NOTE — Patient Instructions (Addendum)
Medication Instructions:  START ASPIRIN 81 MG DAILY   Labwork: none  Testing/Procedures: none  Follow-Up: Your physician wants you to follow-up in: 1 year ov  You will receive a reminder letter in the mail two months in advance. If you don't receive a letter, please call our office to schedule the follow-up appointment.  If you need a refill on your cardiac medications before your next appointment, please call your pharmacy.

## 2018-02-19 NOTE — Progress Notes (Signed)
Cardiology Office Note   Date:  02/19/2018   ID:  Roy ArdBruce Grenz, DOB 1956-12-17, MRN [id number]  PCP:  Richmond CampbellKaplan, Kristen W., PA-C  Cardiologist:   Chilton Siiffany Sikeston, MD   Chief Complaint  Patient presents with  . Follow-up     History of Present Illness: Roy Moyer is a 62 y.o. male with a small VSD, hypertension, and hyperlipidemia who presents for follow up.  [name] reports being diagnosed with pulmonary stenosis as an infant.  He had a heart catheterization but never required any surgery or interventions.  He last had an echo four years ago and was told that he no longer had pulmonary stenosis.  He had an echo 06/2016 that revealed a small VSD with normal systolic function and left ventricular size.  There was no pulmonic stenosis.  At his last appointment his blood pressure was elevated.  However he wanted to work on diet and exercise before starting any medication.  In the past he felt lightheaded and dizzy when taking losartan.  He brings a log of his blood pressures that show that at home it has been mostly in the 110s to low 130s.  He has started to exercise more regularly.  He works out 2 or 3 times per week either on the elliptical or treadmill.  He exercises for 20-30 minutes each time.  He has no chest pain or shortness of breath with activity.  He also has not noted any lower extremity edema, orthopnea, or PND.  He had labs with his PCP recently that showed that his  HDL had dropped and his LDL was elevated again.  He reports that his diet has been pretty good.  He rarely eats red meat and limits fried foods and fatty foods.  He has mostly poultry or fish.   Past Medical History:  Diagnosis Date  . Hypertension   . Pulmonary stenosis 06/17/2016  . VSD (ventricular septal defect) 06/17/2016    History reviewed. No pertinent surgical history.   Current Outpatient Medications  Medication Sig Dispense Refill  . aspirin EC 81 MG tablet Take 81 mg by mouth daily.     No  current facility-administered medications for this visit.     Allergies:   Patient has no allergy information on record.    Social History:  The patient  reports that  has never smoked. he has never used smokeless tobacco. He reports that he drinks alcohol. He reports that he does not use drugs.   Family History:  The patient's family history includes Hypertension in his father and mother.    ROS:  Please see the history of present illness.   Otherwise, review of systems are positive for none.   All other systems are reviewed and negative.    PHYSICAL EXAM: VS:  BP 136/84   Pulse 78   Ht 5\' 7"  (1.702 m)   Wt 154 lb 12.8 oz (70.2 kg)   BMI 24.25 kg/m  , BMI Body mass index is 24.25 kg/m. GENERAL:  Well appearing HEENT: Pupils equal round and reactive, fundi not visualized, oral mucosa unremarkable NECK:  No jugular venous distention, waveform within normal limits, carotid upstroke brisk and symmetric, no bruits LUNGS:  Clear to auscultation bilaterally HEART:  RRR.  PMI not displaced or sustained,S1 and S2 within normal limits, no S3, no S4, no clicks, no rubs, no murmurs ABD:  Flat, positive bowel sounds normal in frequency in pitch, no bruits, no rebound, no guarding, no midline pulsatile  mass, no hepatomegaly, no splenomegaly EXT:  2 plus pulses throughout, no edema, no cyanosis no clubbing SKIN:  No rashes no nodules NEURO:  Cranial nerves II through XII grossly intact, motor grossly intact throughout PSYCH:  Cognitively intact, oriented to person place and time   EKG:  EKG is not ordered today. The ekg ordered 319/14 demonstrates sinus rhythm rate 72 bpm.   12/11/17: Sinus rhythm.  Rate 64 bpm.    Echo 07/03/16: History: PMH: Hyperlipidemia. Risk factors: Hypertension.  ------------------------------------------------------------------- Study Conclusions  - Left ventricle: The cavity size was normal. Wall thickness was  normal. Systolic function was normal. The  estimated ejection  fraction was in the range of 60% to 65%. Wall motion was normal;  there were no regional wall motion abnormalities. - Right atrium: The atrium was mildly dilated.  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  There is no pulmonic stenosis .   Recent Labs: No results found for requested labs within last 8760 hours.    Lipid Panel No results found for: CHOL, TRIG, HDL, CHOLHDL, VLDL, LDLCALC, LDLDIRECT   04/30/83:  TSH 2.29  Total cholesterol 198, triglycerides 80, HDL 55, LDL 119 Sodium 143, potassium 4.3, BUN 15, creatinine 0.85 AST 19, ALT 28 WBC 6.6, Hgb 14.6, Hct 42.7, platelets 207   Wt Readings from Last 3 Encounters:  02/19/18 154 lb 12.8 oz (70.2 kg)  12/11/17 152 lb 6.4 oz (69.1 kg)  07/12/16 165 lb (74.8 kg)      ASSESSMENT AND PLAN:  # Pulmonary Stenosis: # VSD:  Stable.  Mr. Bart did not have any pulmonary stenosis on his last two echos.  He never required intervention and this likely resolved as he got older.  He still has a small VSD but there is no hemodynamic consequence at this time.  There was no evidence of LV overload or systolic dysfunction.  Endocarditis prophylaxis is not indicated.    # Hypertension: BP is elevated in the office but has been well-controlled at home.  He did not tolerate low-dose blood pressure medication in the past.  Continue to monitor and have recommended that he increase his exercise to 150 minutes/week.  # Hyperlipidemia: ASCVD 10 year risk 10.7%.  He wants to continue working on diet and exercise.  If his lipids remain elevated at his next PCP appointment he will consider restarting his statin.  He will start aspirin 81 mg daily.  Current medicines are reviewed at length with the patient today.  The patient does not have concerns regarding medicines.  The following changes have been made:  no change  Labs/ tests ordered today include:   No orders of the defined types were placed in this encounter.    Disposition:   FU with Ameia Morency C. Duke Salvia, MD, [facility] in 1 year.  This note was written with the assistance of speech recognition software.  Please excuse any transcriptional errors.  Signed, Nichols Corter C. Duke Salvia, MD, [facility]  02/19/2018 10:15 AM    [facility]

## 2018-10-14 ENCOUNTER — Ambulatory Visit (INDEPENDENT_AMBULATORY_CARE_PROVIDER_SITE_OTHER): Payer: Commercial Managed Care - PPO | Admitting: Cardiovascular Disease

## 2018-10-14 ENCOUNTER — Encounter: Payer: Self-pay | Admitting: Cardiovascular Disease

## 2018-10-14 VITALS — BP 140/82 | HR 59 | Ht 67.0 in | Wt 152.8 lb

## 2018-10-14 DIAGNOSIS — E7800 Pure hypercholesterolemia, unspecified: Secondary | ICD-10-CM | POA: Diagnosis not present

## 2018-10-14 DIAGNOSIS — R079 Chest pain, unspecified: Secondary | ICD-10-CM | POA: Diagnosis not present

## 2018-10-14 DIAGNOSIS — I10 Essential (primary) hypertension: Secondary | ICD-10-CM | POA: Diagnosis not present

## 2018-10-14 DIAGNOSIS — R0789 Other chest pain: Secondary | ICD-10-CM | POA: Diagnosis not present

## 2018-10-14 HISTORY — DX: Essential (primary) hypertension: I10

## 2018-10-14 HISTORY — DX: Other chest pain: R07.89

## 2018-10-14 MED ORDER — AMLODIPINE BESYLATE 2.5 MG PO TABS: 2.5000 mg | ORAL_TABLET | Freq: Every day | ORAL | 3 refills | Status: DC

## 2018-10-14 NOTE — Progress Notes (Signed)
Cardiology Office Note   Date:  10/14/2018   ID:  Roy Moyer, DOB 1956/12/21, MRN [id number]  PCP:  Roy Moyer., PA-C  Cardiologist:   Chilton Si, MD   Chief Complaint  Patient presents with  . Chest Pain    dull pain     History of Present Illness: Roy Moyer is a 62 y.o. male with a small VSD, hypertension, and hyperlipidemia who presents for follow up.  Roy Moyer reports being diagnosed with pulmonary stenosis as an infant.  He had a heart catheterization but never required any surgery or interventions.  He last had an echo four years ago and was told that he no longer had pulmonary stenosis.  He had an echo 06/2016 that revealed a small VSD with normal systolic function and left ventricular size.  There was no pulmonic stenosis.    Roy Moyer BP has been elevated at times in prior office visits.  However it had been well have been controlled at home.  Lately he thinks it is been running mostly in the 130s systolic.  He previously tried taking losartan but felt poorly and discontinued it.  He continues to exercise regularly.  For the last 5 days he has had left-sided chest pain and left arm pain.  The episodes do not typically occur with exertion.  However he notes them more in the morning and it seems to get better throughout the day.  He is able to walk up 3 flights of steps without experience in the chest pain.  It is not associated with shortness of breath, nausea, or diaphoresis.  When he presses on his chest he feels slightly better.  He is not able to re-create the symptoms with any particular movements.  He wonders if it may be due to resistance training, though he has not started any new regimens and has not increased his weight lately.  In general he has felt well.  He has no lower extremity edema, orthopnea, or PND.  His diet remains very healthy.  He is scheduled to have lipids rechecked with his PCP soon.   Past Medical History:  Diagnosis Date  .  Atypical chest pain 10/14/2018  . Essential hypertension 10/14/2018  . Hypertension   . Pulmonary stenosis 06/17/2016  . VSD (ventricular septal defect) 06/17/2016    History reviewed. No pertinent surgical history.   Current Outpatient Medications  Medication Sig Dispense Refill  . aspirin EC 81 MG tablet Take 81 mg by mouth daily.    Marland Kitchen amLODipine (NORVASC) 2.5 MG tablet Take 1 tablet (2.5 mg total) by mouth daily. 90 tablet 3   No current facility-administered medications for this visit.     Allergies:   Patient has no known allergies.    Social History:  The patient  reports that he has never smoked. He has never used smokeless tobacco. He reports that he drinks alcohol. He reports that he does not use drugs.   Family History:  The patient's family history includes Hypertension in his father and mother.    ROS:  Please see the history of present illness.   Otherwise, review of systems are positive for none.   All other systems are reviewed and negative.    PHYSICAL EXAM: VS:  BP 140/82   Pulse (!) 59   Ht 5\' 7"  (1.702 m)   Wt 152 lb 12.8 oz (69.3 kg)   BMI 23.93 kg/m  , BMI Body mass index is 23.93 kg/m. GENERAL:  Well  appearing HEENT: Pupils equal round and reactive, fundi not visualized, oral mucosa unremarkable NECK:  No jugular venous distention, waveform within normal limits, carotid upstroke brisk and symmetric, no bruits, no thyromegaly LYMPHATICS:  No cervical adenopathy LUNGS:  Clear to auscultation bilaterally HEART:  RRR.  PMI not displaced or sustained,S1 and S2 within normal limits, no S3, no S4, no clicks, no rubs, III/VI holosystolic murmur ABD:  Flat, positive bowel sounds normal in frequency in pitch, no bruits, no rebound, no guarding, no midline pulsatile mass, no hepatomegaly, no splenomegaly EXT:  2 plus pulses throughout, no edema, no cyanosis no clubbing SKIN:  No rashes no nodules NEURO:  Cranial nerves II through XII grossly intact, motor  grossly intact throughout PSYCH:  Cognitively intact, oriented to person place and time   EKG:  EKG is ordered today. The ekg ordered 319/14 demonstrates sinus rhythm rate 72 bpm.   12/11/17: Sinus rhythm.  Rate 64 bpm.   10/14/18: Sinus bradycardia.  Rate 59 bpm.  Nonspecific ST-T changes.   Echo 07/03/16: History: PMH: Hyperlipidemia. Risk factors: Hypertension.  ------------------------------------------------------------------- Study Conclusions  - Left ventricle: The cavity size was normal. Wall thickness was  normal. Systolic function was normal. The estimated ejection  fraction was in the range of 60% to 65%. Wall motion was normal;  there were no regional wall motion abnormalities. - Right atrium: The atrium was mildly dilated.  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  There is no pulmonic stenosis .   Recent Labs: No results found for requested labs within last 8760 hours.    Lipid Panel No results found for: CHOL, TRIG, HDL, CHOLHDL, VLDL, LDLCALC, LDLDIRECT   4/0/98:  TSH 2.29  Total cholesterol 198, triglycerides 80, HDL 55, LDL 119 Sodium 143, potassium 4.3, BUN 15, creatinine 0.85 AST 19, ALT 28 WBC 6.6, Hgb 14.6, Hct 42.7, platelets 207   Wt Readings from Last 3 Encounters:  10/14/18 152 lb 12.8 oz (69.3 kg)  02/19/18 154 lb 12.8 oz (70.2 kg)  12/11/17 152 lb 6.4 oz (69.1 kg)      ASSESSMENT AND PLAN:  # Atypical chest pain: Symptoms are atypical.  I suspect that it is musculoskeletal.  However, we will get an exercise Myoview to better assess.  He has mild, non-specific changes on his EKG that may make an ETT uninterpretable.  # Pulmonary Stenosis: # VSD:  Stable.  Roy Moyer did not have any pulmonary stenosis on his last two echos.  He never required intervention and this likely resolved as he got older.  He still has a small VSD but there is no hemodynamic  consequence at this time.  There was no evidence of LV overload or systolic dysfunction.  Endocarditis prophylaxis is not indicated.    # Hypertension: BP has been elevated at several appointments.  He already exercises regularly and has a very healthy diet.  He has not tolerated losartan in the past.  We will try amlodipine 2.5 mg daily.  He will check his blood pressures at home.  # Hyperlipidemia: ASCVD 10 year risk 10.7%.  He wants to continue working on diet and exercise.  His lipids will be checked with his PCP next month and he will forward these results to [place].  Current medicines are reviewed at length with the patient today.  The patient does not have concerns regarding medicines.  The following changes have been made:  no  change  Labs/ tests ordered today include:   Orders Placed This Encounter  Procedures  . MYOCARDIAL PERFUSION IMAGING  . EKG 12-Lead     Disposition:   FU with Roy Semel C. Duke Salvia, MD, [facility] in 2 months.    Signed, Jenness Stemler C. Duke Salvia, MD, [facility]  10/14/2018 3:50 PM    [facility]

## 2018-10-14 NOTE — Patient Instructions (Addendum)
Medication Instructions:  START AMLODIPINE 2.5 MG DAILY   If you need a refill on your cardiac medications before your next appointment, please call your pharmacy.   Lab work: NONE  Testing/Procedures: Your physician has requested that you have en exercise stress myoview. For further information please visit [email or web address] Please follow instruction sheet, as given.  Follow-Up: At [facility], you and your health needs are our priority.  As part of our continuing mission to provide you with exceptional heart care, we have created designated Provider Care Teams.  These Care Teams include your primary Cardiologist (physician) and Advanced Practice Providers (APPs -  Physician Assistants and Nurse Practitioners) who all work together to provide you with the care you need, when you need it. You will need a follow up appointment in 2 months. You may see Chilton Si, MD or one of the following Advanced Practice Providers on your designated Care Team:   Corine Shelter, PA-C Judy Pimple, [place] . Marjie Skiff, PA-C  Cardiac Nuclear Scan A cardiac nuclear scan is a test that measures blood flow to the heart when a person is resting and when he or she is exercising. The test looks for problems such as:  Not enough blood reaching a portion of the heart.  The heart muscle not working normally.  You may need this test if:  You have heart disease.  You have had abnormal lab results.  You have had heart surgery or angioplasty.  You have chest pain.  You have shortness of breath.  In this test, a radioactive dye (tracer) is injected into your bloodstream. After the tracer has traveled to your heart, an imaging device is used to measure how much of the tracer is absorbed by or distributed to various areas of your heart. This procedure is usually done at a hospital and takes 2-4 hours. Tell a health care provider about:  Any allergies you have.  All medicines you are taking,  including vitamins, herbs, eye drops, creams, and over-the-counter medicines.  Any problems you or family members have had with the use of anesthetic medicines.  Any blood disorders you have.  Any surgeries you have had.  Any medical conditions you have.  Whether you are pregnant or may be pregnant. What are the risks? Generally, this is a safe procedure. However, problems may occur, including:  Serious chest pain and heart attack. This is only a risk if the stress portion of the test is done.  Rapid heartbeat.  Sensation of warmth in your chest. This usually passes quickly.  What happens before the procedure?  Ask your health care provider about changing or stopping your regular medicines. This is especially important if you are taking diabetes medicines or blood thinners.  Remove your jewelry on the day of the procedure. What happens during the procedure?  An IV tube will be inserted into one of your veins.  Your health care provider will inject a small amount of radioactive tracer through the tube.  You will wait for 20-40 minutes while the tracer travels through your bloodstream.  Your heart activity will be monitored with an electrocardiogram (ECG).  You will lie down on an exam table.  Images of your heart will be taken for about 15-20 minutes.  You may be asked to exercise on a treadmill or stationary bike. While you exercise, your heart's activity will be monitored with an ECG, and your blood pressure will be checked. If you are unable to exercise, you may be given a  medicine to increase blood flow to parts of your heart.  When blood flow to your heart has peaked, a tracer will again be injected through the IV tube.  After 20-40 minutes, you will get back on the exam table and have more images taken of your heart.  When the procedure is over, your IV tube will be removed. The procedure may vary among health care providers and hospitals. Depending on the type of  tracer used, scans may need to be repeated 3-4 hours later. What happens after the procedure?  Unless your health care provider tells you otherwise, you may return to your normal schedule, including diet, activities, and medicines.  Unless your health care provider tells you otherwise, you may increase your fluid intake. This will help flush the contrast dye from your body. Drink enough fluid to keep your urine clear or pale yellow.  It is up to you to get your test results. Ask your health care provider, or the department that is doing the test, when your results will be ready. Summary  A cardiac nuclear scan measures the blood flow to the heart when a person is resting and when he or she is exercising.  You may need this test if you are at risk for heart disease.  Tell your health care provider if you are pregnant.  Unless your health care provider tells you otherwise, increase your fluid intake. This will help flush the contrast dye from your body. Drink enough fluid to keep your urine clear or pale yellow. This information is not intended to replace advice given to you by your health care provider. Make sure you discuss any questions you have with your health care provider. Document Released: 01/04/2005 Document Revised: 12/12/2016 Document Reviewed: 11/18/2013 Elsevier Interactive Patient Education  2017 [employer].

## 2018-10-16 ENCOUNTER — Telehealth (HOSPITAL_COMMUNITY): Payer: Self-pay

## 2018-10-16 NOTE — Telephone Encounter (Signed)
Encounter complete. 

## 2018-10-21 ENCOUNTER — Ambulatory Visit (HOSPITAL_COMMUNITY)
Admission: RE | Admit: 2018-10-21 | Discharge: 2018-10-21 | Disposition: A | Discharge status: Home / Self Care | Payer: Commercial Managed Care - PPO | Source: Ambulatory Visit | Attending: Cardiology | Admitting: Cardiology

## 2018-10-21 DIAGNOSIS — R079 Chest pain, unspecified: Secondary | ICD-10-CM | POA: Diagnosis not present

## 2018-10-21 LAB — MYOCARDIAL PERFUSION IMAGING
CHL CUP NUCLEAR SSS: 0
CSEPEDS: 0 s
CSEPEW: 13.7 METS
Exercise duration (min): 12 min
LV sys vol: 136 mL
LVDIAVOL: 66 mL (ref 62–150)
MPHR: 160 {beats}/min
NUC STRESS TID: 1.15
Peak HR: 151 {beats}/min
Percent HR: 94 %
RPE: 17
Rest HR: 62 {beats}/min
SDS: 0
SRS: 0

## 2018-10-21 MED ORDER — TECHNETIUM TC 99M TETROFOSMIN IV KIT
10.1000 | PACK | Freq: Once | INTRAVENOUS | Status: AC | PRN
Start: 2018-10-21 — End: 2018-10-21
  Administered 2018-10-21: 10.1 via INTRAVENOUS
  Filled 2018-10-21: qty 11

## 2018-10-21 MED ORDER — TECHNETIUM TC 99M TETROFOSMIN IV KIT
29.2000 | PACK | Freq: Once | INTRAVENOUS | Status: AC | PRN
  Administered 2018-10-21: 29.2 via INTRAVENOUS
  Filled 2018-10-21: qty 30

## 2018-12-30 ENCOUNTER — Ambulatory Visit: Payer: Commercial Managed Care - PPO | Admitting: Cardiovascular Disease

## 2019-01-22 ENCOUNTER — Ambulatory Visit: Payer: Commercial Managed Care - PPO | Admitting: Cardiovascular Disease

## 2019-03-03 ENCOUNTER — Other Ambulatory Visit: Payer: Self-pay

## 2019-03-03 ENCOUNTER — Encounter (HOSPITAL_COMMUNITY): Payer: Self-pay | Admitting: Emergency Medicine

## 2019-03-03 ENCOUNTER — Emergency Department (HOSPITAL_COMMUNITY): Payer: Commercial Managed Care - PPO

## 2019-03-03 ENCOUNTER — Emergency Department (HOSPITAL_COMMUNITY)
Admission: EM | Admit: 2019-03-03 | Discharge: 2019-03-03 | Disposition: A | Discharge status: Home / Self Care | Payer: Commercial Managed Care - PPO | Attending: Emergency Medicine | Admitting: Emergency Medicine

## 2019-03-03 DIAGNOSIS — Y998 Other external cause status: Secondary | ICD-10-CM | POA: Diagnosis not present

## 2019-03-03 DIAGNOSIS — I10 Essential (primary) hypertension: Secondary | ICD-10-CM | POA: Diagnosis not present

## 2019-03-03 DIAGNOSIS — Y9389 Activity, other specified: Secondary | ICD-10-CM | POA: Diagnosis not present

## 2019-03-03 DIAGNOSIS — R0789 Other chest pain: Secondary | ICD-10-CM | POA: Insufficient documentation

## 2019-03-03 DIAGNOSIS — Z79899 Other long term (current) drug therapy: Secondary | ICD-10-CM | POA: Diagnosis not present

## 2019-03-03 DIAGNOSIS — X58XXXA Exposure to other specified factors, initial encounter: Secondary | ICD-10-CM | POA: Insufficient documentation

## 2019-03-03 DIAGNOSIS — S46912A Strain of unspecified muscle, fascia and tendon at shoulder and upper arm level, left arm, initial encounter: Secondary | ICD-10-CM | POA: Diagnosis not present

## 2019-03-03 DIAGNOSIS — R079 Chest pain, unspecified: Secondary | ICD-10-CM | POA: Diagnosis present

## 2019-03-03 DIAGNOSIS — Y9289 Other specified places as the place of occurrence of the external cause: Secondary | ICD-10-CM | POA: Insufficient documentation

## 2019-03-03 LAB — CBC
HCT: 44.6 % (ref 39.0–52.0)
HEMOGLOBIN: 15 g/dL (ref 13.0–17.0)
MCH: 31.2 pg (ref 26.0–34.0)
MCHC: 33.6 g/dL (ref 30.0–36.0)
MCV: 92.7 fL (ref 80.0–100.0)
Platelets: 216 10*3/uL (ref 150–400)
RBC: 4.81 MIL/uL (ref 4.22–5.81)
RDW: 12.1 % (ref 11.5–15.5)
WBC: 6.1 10*3/uL (ref 4.0–10.5)
nRBC: 0 % (ref 0.0–0.2)

## 2019-03-03 LAB — BASIC METABOLIC PANEL
Anion gap: 8 (ref 5–15)
BUN: 13 mg/dL (ref 8–23)
CHLORIDE: 104 mmol/L (ref 98–111)
CO2: 25 mmol/L (ref 22–32)
CREATININE: 0.85 mg/dL (ref 0.61–1.24)
Calcium: 9.5 mg/dL (ref 8.9–10.3)
GFR calc Af Amer: >60 mL/min (ref >60)
GFR calc non Af Amer: >60 mL/min (ref >60)
Glucose, Bld: 121 mg/dL — ABNORMAL HIGH (ref 70–99)
Potassium: 4.1 mmol/L (ref 3.5–5.1)
Sodium: 137 mmol/L (ref 135–145)

## 2019-03-03 LAB — I-STAT TROPONIN, ED: Troponin i, poc: 0 ng/mL (ref 0.00–0.08)

## 2019-03-03 MED ORDER — NAPROXEN 500 MG PO TABS: 500.0000 mg | ORAL_TABLET | Freq: Two times a day (BID) | ORAL | 0 refills | Status: DC

## 2019-03-03 MED ORDER — SODIUM CHLORIDE 0.9% FLUSH: 3.0000 mL | Freq: Once | INTRAVENOUS | Status: DC

## 2019-03-03 NOTE — ED Notes (Signed)
ED Provider at bedside. 

## 2019-03-03 NOTE — ED Triage Notes (Signed)
Pt states he has some chest pain over the weekend and left arm numbness over the weekend as well. Pt states the numbness went away but came back again this morning @ midnight, 11 hrs ago. Pt reports it feels like an ache and tingle.

## 2019-03-03 NOTE — Discharge Instructions (Signed)
Try using the naproxen as needed for pain as well as hot shower heat therapy, muscle rubs and avoid keeping your hand raised above your head for prolonged period of times or heavy lifting with the left arm.  If it does not get better you should follow-up with orthopedics for further evaluation.

## 2019-03-03 NOTE — ED Provider Notes (Signed)
[facility] EMERGENCY DEPARTMENT Provider Note   CSN: [id number] Arrival date & time: 03/03/19  1047    History   Chief Complaint Chief Complaint  Patient presents with  . Chest Pain  . Left Arm Numbness    HPI Arbaaz Declercq is a 63 y.o. male.     Patient is a 63 year old male with history of VSD, pulmonary stenosis and hypertension presenting today with 4 days of intermittent left chest pain and left arm discomfort.  Patient states on Saturday he noticed some tightness in the left upper portion of his chest that kind of would come and go and was not related to exertion.  He then started noticing the discomfort in his left shoulder and radiating down into his left arm.  He describes as an aching dull sensation that will cause numbness to his fourth and fifth fingers.  He denies any weakness of the hand.  He did not have any chest pain on Sunday but did have the discomfort in his arm.  Yesterday he felt pretty normal but then in the middle the night woke up with more arm symptoms.  He does not have any chest tightness or discomfort at this time but states today even with lifting his arm at times it was a severe discomfort.  He was also feeling some slight tenderness in his shoulder blade area as well.  He has not had recent cough cold or congestion.  He has had no fever.  He has no shortness of breath or exertional issues.  He cannot recall any trauma or new activity prior to the symptoms starting.  The history is provided by the patient.  Chest Pain  Pain location:  L chest Pain quality: aching and tightness   Pain radiates to:  L shoulder and L arm Pain severity:  Moderate Onset quality:  Gradual Duration:  4 days Timing:  Intermittent Progression:  Resolved Chronicity:  New Context: lifting, movement and raising an arm   Relieved by:  None tried Worsened by:  Certain positions Ineffective treatments:  None tried Associated symptoms: numbness   Associated  symptoms: no anorexia, no back pain, no cough, no diaphoresis, no fever, no heartburn, no lower extremity edema, no nausea, no palpitations, no shortness of breath, no vomiting and no weakness   Risk factors: no smoking and no surgery   Risk factors comment:  Hx of VSD and HTN.  followed yearly by cardiology.  no anticoagulation   Past Medical History:  Diagnosis Date  . Atypical chest pain 10/14/2018  . Essential hypertension 10/14/2018  . Hypertension   . Pulmonary stenosis 06/17/2016  . VSD (ventricular septal defect) 06/17/2016    Patient Active Problem List   Diagnosis Date Noted  . Essential hypertension 10/14/2018  . Atypical chest pain 10/14/2018  . VSD (ventricular septal defect) 06/17/2016  . HLD (hyperlipidemia) 05/28/2016    History reviewed. No pertinent surgical history.      Home Medications    Prior to Admission medications   Medication Sig Start Date End Date Taking? Authorizing Provider  amLODipine (NORVASC) 2.5 MG tablet Take 1 tablet (2.5 mg total) by mouth daily. 10/14/18 01/12/19  Chilton Si, MD  aspirin EC 81 MG tablet Take 81 mg by mouth daily.    [provider]    Family History Family History  Problem Relation Age of Onset  . Hypertension Mother   . Hypertension Father     Social History Social History   Tobacco Use  .  Smoking status: Never Smoker  . Smokeless tobacco: Never Used  Substance Use Topics  . Alcohol use: Yes  . Drug use: No     Allergies   Patient has no known allergies.   Review of Systems Review of Systems  Constitutional: Negative for diaphoresis and fever.  Respiratory: Negative for cough and shortness of breath.   Cardiovascular: Positive for chest pain. Negative for palpitations.  Gastrointestinal: Negative for anorexia, heartburn, nausea and vomiting.  Musculoskeletal: Negative for back pain.  Neurological: Positive for numbness. Negative for weakness.  All other systems reviewed and are  negative.    Physical Exam Updated Vital Signs BP (!) 152/81 (BP Location: Right Arm)   Pulse 66   Temp 97.9 F (36.6 C) (Oral)   Resp 16   Ht  (1.702 m)   Wt 68 kg   SpO2 98%   BMI 23.49 kg/m   Physical Exam Vitals signs and nursing note reviewed.  Constitutional:      General: He is not in acute distress.    Appearance: He is well-developed.  HENT:     Head: Normocephalic and atraumatic.     Nose: Nose normal.  Eyes:     Conjunctiva/sclera: Conjunctivae normal.     Pupils: Pupils are equal, round, and reactive to light.  Neck:     Musculoskeletal: Normal range of motion and neck supple. No neck rigidity or muscular tenderness.  Cardiovascular:     Rate and Rhythm: Normal rate and regular rhythm.     Pulses: Normal pulses.     Heart sounds: Murmur present. Systolic murmur present with a grade of 3/6.  Pulmonary:     Effort: Pulmonary effort is normal. No respiratory distress.     Breath sounds: Normal breath sounds. No wheezing or rales.  Abdominal:     General: There is no distension.     Palpations: Abdomen is soft.     Tenderness: There is no abdominal tenderness. There is no guarding or rebound.  Musculoskeletal: Normal range of motion.        General: No tenderness.     Left shoulder: He exhibits normal range of motion, no bony tenderness, no deformity, normal pulse and normal strength.       Arms:  Skin:    General: Skin is warm and dry.     Findings: No erythema or rash.  Neurological:     Mental Status: He is alert and oriented to person, place, and time.     Comments: 5/5 left hand grip but mild decreased sensation in the 4th/5th digits  Psychiatric:        Mood and Affect: Mood normal.        Behavior: Behavior normal.        Thought Content: Thought content normal.      ED Treatments / Results  Labs (all labs ordered are listed, but only abnormal results are displayed) Labs Reviewed  BASIC METABOLIC PANEL - Abnormal; Notable for the  following components:      Result Value   Glucose, Bld 121 (*)    All other components within normal limits  CBC  I-STAT TROPONIN, ED    EKG EKG Interpretation  Date/Time:  Tuesday March 03 2019 10:52:38 EDT Ventricular Rate:  69 PR Interval:  184 QRS Duration: 92 QT Interval:  404 QTC Calculation: 432 R Axis:   80 Text Interpretation:  Normal sinus rhythm Normal ECG No previous tracing Confirmed by Gwyneth Sprout ([id number]) on 03/03/2019 11:22:48  AM   Radiology Dg Chest 2 View  Result Date: 03/03/2019 CLINICAL DATA:  Chest pain.  History of ventricular septal defect. EXAM: CHEST - 2 VIEW COMPARISON:  None. FINDINGS: Right-sided aortic arch. Overall heart size and pulmonary vascularity are normal. The lungs are clear. No effusions. No bone abnormality. IMPRESSION: No acute abnormalities.  Right-sided aortic arch. Electronically Signed   By: Francene Boyers M.D.   On: 03/03/2019 12:01   Dg Shoulder Left  Result Date: 03/03/2019 CLINICAL DATA:  Pain. EXAM: LEFT SHOULDER - 2+ VIEW COMPARISON:  None. FINDINGS: There is no evidence of fracture or dislocation. There is no evidence of arthropathy or other focal bone abnormality. Soft tissues are unremarkable. IMPRESSION: Normal exam. Electronically Signed   By: Francene Boyers M.D.   On: 03/03/2019 12:02    Procedures Procedures (including critical care time)  Medications Ordered in ED Medications  sodium chloride flush (NS) 0.9 % injection 3 mL (3 mLs Intravenous Not Given 03/03/19 1127)     Initial Impression / Assessment and Plan / ED Course  I have reviewed the triage vital signs and the nursing notes.  Pertinent labs & imaging results that were available during my care of the patient were reviewed by me and considered in my medical decision making (see chart for details).       Patient presenting with atypical type chest pain that started on Saturday but more specifically he is complaining of left arm pain radiation down the  arm and some numbness in the fourth and fifth fingers.  Patient states he is even had some pain in the scapular region.  He denies any systemic symptoms such as shortness of breath, lower extremity swelling, cough, congestion or fever.  He is well-appearing on exam and relatively normal vital signs except for mild hypertension.  Low suspicion for stroke at this time, ACS or dissection.  EKG without acute findings.  Suspect more likely this is musculoskeletal in nature.  It is not caused by exertion and is positional.  Concern for possible muscle spasm and nerve irritation versus arthritis.  Labs and x-ray are pending  12:47 PM Labs and imaging within normal limits.  Patient does have known heart anomalies but do not feel that is the cause of his symptoms today.  Feel most likely musculoskeletal.  Will treat as such and given orthopedic follow-up.  Final Clinical Impressions(s) / ED Diagnoses   Final diagnoses:  Atypical chest pain  Muscle strain of left shoulder region, initial encounter    ED Discharge Orders         Ordered    naproxen (NAPROSYN) 500 MG tablet  2 times daily     03/03/19 1245           Gwyneth Sprout, MD 03/03/19 1248

## 2019-03-03 NOTE — ED Notes (Signed)
Patient verbalizes understanding of discharge instructions. Opportunity for questioning and answers were provided. Armband removed by staff, pt discharged from ED ambulatory to home.  

## 2019-03-04 ENCOUNTER — Ambulatory Visit (INDEPENDENT_AMBULATORY_CARE_PROVIDER_SITE_OTHER): Payer: Commercial Managed Care - PPO | Admitting: Family Medicine

## 2019-03-04 ENCOUNTER — Encounter (INDEPENDENT_AMBULATORY_CARE_PROVIDER_SITE_OTHER): Payer: Self-pay | Admitting: Family Medicine

## 2019-03-04 DIAGNOSIS — M542 Cervicalgia: Secondary | ICD-10-CM

## 2019-03-04 DIAGNOSIS — M79602 Pain in left arm: Secondary | ICD-10-CM

## 2019-03-04 MED ORDER — METHOCARBAMOL 750 MG PO TABS: 750.0000 mg | ORAL_TABLET | Freq: Every evening | ORAL | 1 refills | Status: DC | PRN

## 2019-03-04 NOTE — Patient Instructions (Signed)
   Vitamin D3:  Take 2,000 IU daily  Coenzyme Q10:  Take 100 mg daily  Magnesium:  400 mg daily

## 2019-03-04 NOTE — Progress Notes (Signed)
Office Visit Note   Patient: Roy Moyer           Date of Birth: 1956-12-05           MRN: [id number] Visit Date: 03/04/2019 Requested by: Richmond Campbell., PA-C [address] [place] [address] PCP: Richmond Campbell., PA-C  Subjective: Chief Complaint  Patient presents with  . Neck - Pain    Intermittent pain in the left arm with some intermittent numbness & tingling in the left hand. Pain in the upper back, above the shoulder blades.    HPI: He is a 63 year old right-hand-dominant male with left neck and arm pain.  Symptoms started a few days ago, he woke up with a tingling sensation in his left arm.  As the day went on his symptoms improved but the next day he woke up with neck pain.  Once again, pain got better as the day went on but then yesterday he developed chest pain along with his other symptoms.  He does have a history of VSD, pulmonary stenosis and hypertension so he went to the ER where cardiac source was ruled out.  He was given naproxen for his pain but he has not noticed much difference with it.  The pain is not severe.  Today he is not having any arm tingling but is having left-sided pain in the posterior shoulder/base of the neck.  He does note that in November he was placed on Lipitor and he remembers having some tingling in his arm for the first few days after starting it.  He has had some other symptoms which he thought were related but he has continued to take the medication.  His lipids have been borderline, and he has no history of coronary artery disease to his knowledge.  He is trying to make some dietary changes              ROS: Otherwise noncontributory  Objective: Vital Signs: There were no vitals taken for this visit.  Physical Exam:  Neck: Full range of motion, negative Spurling's test.  He does have some pain when he extends his neck to the right and rotates it to the left.  He has a tender trigger point to the left of midline at about the  T1-2 level, this seems to reproduce his pain.  Full range of motion of the shoulder, 5/5 rotator cuff strength pain-free, 5/5 upper extremity strength and 2+ DTRs.  Positive Tinel's at the left carpal tunnel but negative Phalen's test.  Imaging: None today.  Assessment & Plan: 1.  Neck and left arm pain, suspect myofascial trigger point as the source of his pain.  Cannot rule out cervical nerve impingement.  Lipitor is a potential source for his symptoms. -Muscle relaxant as needed, deep tissue massage using a tennis ball.  Physical therapy if symptoms are not improving.  X-rays and neck MRI scan if he fails conservative management. -Trial of vitamin D3, magnesium, and coenzyme Q 10. -May need to discuss stopping Lipitor for a little while and switching to something different if indicated.     Procedures: No procedures performed  No notes on file     PMFS History: Patient Active Problem List   Diagnosis Date Noted  . Essential hypertension 10/14/2018  . Atypical chest pain 10/14/2018  . VSD (ventricular septal defect) 06/17/2016  . HLD (hyperlipidemia) 05/28/2016   Past Medical History:  Diagnosis Date  . Atypical chest pain 10/14/2018  . Essential hypertension 10/14/2018  .  Hypertension   . Pulmonary stenosis 06/17/2016  . VSD (ventricular septal defect) 06/17/2016    Family History  Problem Relation Age of Onset  . Hypertension Mother   . Hypertension Father     History reviewed. No pertinent surgical history. Social History   Occupational History  . Not on file  Tobacco Use  . Smoking status: Never Smoker  . Smokeless tobacco: Never Used  Substance and Sexual Activity  . Alcohol use: Yes  . Drug use: No  . Sexual activity: Yes

## 2019-03-06 ENCOUNTER — Encounter (INDEPENDENT_AMBULATORY_CARE_PROVIDER_SITE_OTHER): Payer: Self-pay | Admitting: Family Medicine

## 2019-03-10 ENCOUNTER — Encounter: Payer: Self-pay | Admitting: Physical Therapy

## 2019-03-10 ENCOUNTER — Ambulatory Visit: Payer: Commercial Managed Care - PPO | Attending: Family Medicine | Admitting: Physical Therapy

## 2019-03-10 ENCOUNTER — Other Ambulatory Visit: Payer: Self-pay

## 2019-03-10 DIAGNOSIS — M5412 Radiculopathy, cervical region: Secondary | ICD-10-CM | POA: Insufficient documentation

## 2019-03-10 DIAGNOSIS — M6281 Muscle weakness (generalized): Secondary | ICD-10-CM

## 2019-03-10 DIAGNOSIS — M542 Cervicalgia: Secondary | ICD-10-CM | POA: Diagnosis present

## 2019-03-10 NOTE — Therapy (Signed)
[facility] [address] [place], [place], [address] Phone: [phone]   Fax:  [phone]  Physical Therapy Evaluation  Patient Details  Name: Roy Moyer MRN: [id number] Date of Birth: 1956/11/27 Referring Provider (PT): Lavada Mesi   Encounter Date: 03/10/2019  PT End of Session - 03/10/19 0939    Visit Number  1    Number of Visits  12    Date for PT Re-Evaluation  04/28/19    Authorization Type  FOTO; Progress note every 10th visit    PT Start Time  0815    PT Stop Time  0901    PT Time Calculation (min)  46 min    Activity Tolerance  Patient tolerated treatment well    Behavior During Therapy  [facility] for tasks assessed/performed       Past Medical History:  Diagnosis Date  . Atypical chest pain 10/14/2018  . Essential hypertension 10/14/2018  . Hypertension   . Pulmonary stenosis 06/17/2016  . VSD (ventricular septal defect) 06/17/2016    History reviewed. No pertinent surgical history.  There were no vitals filed for this visit.   Subjective Assessment - 03/10/19 0934    Subjective  Patient arrives to physical therapy with reports of left shoulder numbness and tingling and cervical neck pain that began about the weeks of 03/01/2019. Patient reports no known injury but did state he was moving an awkward piece of furniture when he felt pinch in his left shoulder two weeks prior to the start of symptoms. Patient reports pain in the morning which dissipates throughout the day and returns at the end of the day. Patient reports he can perform all ADLs but requires increased time to perform. Patient reports pain at worst is 8/10 and pain at best is 0/10. Patient's goals are to decrease pain, improve movement, improve ability to perform home and work activities, and return to normal exercise routine.    Pertinent History  VSD, HTN    Limitations  Lifting;House hold activities    Diagnostic tests  x-ray: chest x-ray (-) for cardio  dx    Patient Stated Goals  reduce pain, return to normal exercise routine    Currently in Pain?  Yes    Pain Score  4     Pain Location  Shoulder    Pain Orientation  Left    Pain Descriptors / Indicators  Sharp    Pain Type  Acute pain    Pain Onset  1 to 4 weeks ago    Pain Frequency  Intermittent    Aggravating Factors   "worse at night"    Pain Relieving Factors  Unsure    Effect of Pain on Daily Activities  "slower with ADLs"         [facility] PT Assessment - 03/10/19 0001      Assessment   Medical Diagnosis  neck pain, Left arm pain    Referring Provider (PT)  Casimiro Needle Hilts    Onset Date/Surgical Date  03/01/19   onset   Hand Dominance  Right    Next MD Visit  n/a    Prior Therapy  no      Precautions   Precautions  None      Restrictions   Weight Bearing Restrictions  No      Balance Screen   Has the patient fallen in the past 6 months  No    Has the patient had a decrease in activity level because of a fear  of falling?   No    Is the patient reluctant to leave their home because of a fear of falling?   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      Posture/Postural Control   Posture/Postural Control  Postural limitations    Postural Limitations  Rounded Shoulders;Forward head;Posterior pelvic tilt      ROM / Strength   AROM / PROM / Strength  AROM;Strength      AROM   Overall AROM Comments  bilateral shoulder AROM WFL    AROM Assessment Site  Cervical    Cervical Flexion  26    Cervical Extension  38    Cervical - Right Side Bend  24    Cervical - Left Side Bend  12    Cervical - Right Rotation  55    Cervical - Left Rotation  58      Strength   Strength Assessment Site  Shoulder    Right/Left Shoulder  Left    Left Shoulder Flexion  4/5    Left Shoulder ABduction  4/5    Left  Shoulder Internal Rotation  4+/5    Left Shoulder External Rotation  4+/5      Palpation   Palpation comment  tenderness to palpation to L UT, L inferior angle of the scapula, left biceps and left triceps      Special Tests    Special Tests  Cervical    Cervical Tests  Spurling's      Spurling's   Findings  Positive    Comment  reproduction of symptoms                Objective measurements completed on examination: See above findings.              PT Education - 03/10/19 0937    Education Details  chin tucks, scapular retractions, corner stretch, education on POC    Person(s) Educated  Patient    Methods  Explanation;Demonstration;Handout    Comprehension  Verbalized understanding;Returned demonstration          PT Long Term Goals - 03/10/19 2105      PT LONG TERM GOAL #1   Title  Patient will be independent with HEP    Time  6    Period  Weeks    Status  New      PT LONG TERM GOAL #2   Title  Patient will report a centralization of neurological symptoms of left UE to reduce nerve irritation.    Time  6    Period  Weeks    Status  New      PT LONG TERM GOAL #3   Title  Patient will demonstrate 4+/5 or greater left shoulder MMT to improve stability with functional tasks.    Time  6    Period  Weeks    Status  New      PT LONG TERM GOAL #4   Title  Patient will report ability to perform functional task and ADLs with pain less than 4/10.    Time  6    Period  Weeks    Status  New             Plan -  03/10/19 0941    Clinical Impression Statement  Patient is a 63 year old right handed male who presents to physical therapy with reports of left shoulder and cervical pain, neurological symptoms to left fifth finger, and upper trapezius and scapular pain. Patient noted with left shoulder weakness. Patient noted with decreased cervical AROM with increased pain with flexion and extension. Patient (+) for Spurling's compression test with  positive reproduction of symptoms. Patient would benefit from skilled physical therapy to address deficits and address patient's goals.     Personal Factors and Comorbidities  Comorbidity 2    Comorbidities  HTN,  VSD    Examination-Activity Limitations  Lift;Carry    Examination-Participation Restrictions  Cleaning    Stability/Clinical Decision Making  Stable/Uncomplicated    Clinical Decision Making  Low    Rehab Potential  Good    PT Frequency  2x / week    PT Duration  6 weeks    PT Treatment/Interventions  ADLs/Self Care Home Management;Cryotherapy;Ultrasound;Traction;Moist Heat;Electrical Stimulation;Functional mobility training;Neuromuscular re-education;Manual techniques;Dry needling;Passive range of motion;Patient/family education;Therapeutic activities;Therapeutic exercise    PT Next Visit Plan  Cervical postural exercises, Manual traction progress to mechanical when appropriate;  STW/M to left UT and trigger points; modalities PRN for pain relief    PT Home Exercise Plan  see patient education section    Consulted and Agree with Plan of Care  Patient       Patient will benefit from skilled therapeutic intervention in order to improve the following deficits and impairments:  Pain, Postural dysfunction, Impaired UE functional use, Decreased range of motion, Decreased strength, Decreased activity tolerance  Visit Diagnosis: Cervicalgia - Plan: PT plan of care cert/re-cert  Radiculopathy, cervical region - Plan: PT plan of care cert/re-cert  Muscle weakness (generalized) - Plan: PT plan of care cert/re-cert     Problem List Patient Active Problem List   Diagnosis Date Noted  . Essential hypertension 10/14/2018  . Atypical chest pain 10/14/2018  . VSD (ventricular septal defect) 06/17/2016  . HLD (hyperlipidemia) 05/28/2016    Guss Bunde, PT, DPT 03/10/2019, 9:19 PM  [facility] [address] [place], [place],  [address] Phone: [phone]   Fax:  [phone]  Name: Roy Moyer MRN: [id number] Date of Birth: 12/29/1955

## 2019-03-13 ENCOUNTER — Ambulatory Visit: Payer: Commercial Managed Care - PPO | Admitting: Physical Therapy

## 2019-03-27 ENCOUNTER — Telehealth: Payer: Self-pay | Admitting: Physical Therapy

## 2019-03-27 NOTE — Telephone Encounter (Signed)
Mr. Roy Moyer was contacted today regarding the temporary reduction of OP Rehab Services due to concerns for community transmission of Covid-19.  Patient instructed to continue HEP; patient reported understanding.

## 2019-04-02 NOTE — Telephone Encounter (Signed)
No Follow-up.

## 2019-04-03 ENCOUNTER — Telehealth: Payer: Self-pay | Admitting: Physical Therapy

## 2019-04-03 NOTE — Telephone Encounter (Signed)
Roy Moyer was contacted today regarding the temporary reduction of OP Rehab Services due to concerns for community transmission of Covid-19.    Therapist advised the patient to continue to perform their HEP and assured they had no unanswered questions at this time. Outpatient Rehabilitation Services will follow up with this client. Patient is aware we can be reached by telephone during limited business hours in the meantime.

## 2019-04-20 ENCOUNTER — Ambulatory Visit: Payer: Commercial Managed Care - PPO | Admitting: Cardiovascular Disease

## 2019-05-21 ENCOUNTER — Telehealth: Payer: Self-pay | Admitting: Physical Therapy

## 2019-05-21 NOTE — Telephone Encounter (Signed)
Roy Moyer was contacted in regards to resuming therapy. Left voicemail for patient to give our office a call back.

## 2019-08-24 ENCOUNTER — Encounter: Payer: Self-pay | Admitting: Cardiovascular Disease

## 2019-08-24 ENCOUNTER — Telehealth (INDEPENDENT_AMBULATORY_CARE_PROVIDER_SITE_OTHER): Payer: Commercial Managed Care - PPO | Admitting: Cardiovascular Disease

## 2019-08-24 DIAGNOSIS — I10 Essential (primary) hypertension: Secondary | ICD-10-CM

## 2019-08-24 DIAGNOSIS — E7800 Pure hypercholesterolemia, unspecified: Secondary | ICD-10-CM

## 2019-08-24 DIAGNOSIS — Q210 Ventricular septal defect: Secondary | ICD-10-CM

## 2019-08-24 NOTE — Patient Instructions (Signed)
Medication Instructions:  Your physician recommends that you continue on your current medications as directed. Please refer to the Current Medication list given to you today.  If you need a refill on your cardiac medications before your next appointment, please call your pharmacy.   Lab work: NONE  Testing/Procedures: NONE  Follow-Up: At [facility], you and your health needs are our priority.  As part of our continuing mission to provide you with exceptional heart care, we have created designated Provider Care Teams.  These Care Teams include your primary Cardiologist (physician) and Advanced Practice Providers (APPs -  Physician Assistants and Nurse Practitioners) who all work together to provide you with the care you need, when you need it. You will need a follow up appointment in 12 months.  Please call our office 2 months in advance to schedule this appointment.  You may see Tiffany [place], MD or one of the following Advanced Practice Providers on your designated Care Team:   Luke Kilroy, PA-C Krista Kroeger, PA-C . Callie Goodrich, PA-C    

## 2019-08-24 NOTE — Progress Notes (Signed)
Virtual Visit via Telephone Note   This visit type was conducted due to national recommendations for restrictions regarding the COVID-19 Pandemic (e.g. social distancing) in an effort to limit this patient's exposure and mitigate transmission in our community.  Due to his co-morbid illnesses, this patient is at least at moderate risk for complications without adequate follow up.  This format is felt to be most appropriate for this patient at this time.  The patient did not have access to video technology/had technical difficulties with video requiring transitioning to audio format only (telephone).  All issues noted in this document were discussed and addressed.  No physical exam could be performed with this format.  Please refer to the patient's chart for his  consent to [facility].   Date:  08/24/2019   ID:  Roy Moyer, DOB 12-Feb-1956, MRN [id number]  Patient Location: Home Provider Location: Office  PCP:  Richmond CampbellKaplan, Kristen W., PA-C  Cardiologist:  Chilton Siiffany Clearwater, MD  Electrophysiologist:  None   Evaluation Performed:  Follow-Up Visit  Chief Complaint:  Follow up  History of Present Illness:    Roy Moyer is a 63 y.o. male with a small VSD, hypertension, and hyperlipidemia who presents for follow up.  Mr. Roy Moyer reports being diagnosed with pulmonary stenosis as an infant.  He had a heart catheterization but never required any surgery or interventions.  He last had an echo four years ago and was told that he no longer had pulmonary stenosis.  He had an echo 06/2016 that revealed a small VSD with normal systolic function and left ventricular size.  There was no pulmonic stenosis.    Since his last appointment Mr. Roy Moyer has been doing well.  At the last appointment he had some atypical chest pain and [facility] that revealed LVEF 51% and no ischemia.  There is mild apical septal wall hypokinesis.  In retrospect he thinks that this  was due to muscle spasms that he has been struggling with.  Symptoms have been much better controlled since he started taking magnesium and other supplements.  He has no chest pain or shortness of breath.  He recently retired and has been very active doing projects around his house.  He is struggling with not being able to travel due to coronavirus.  He denies any lower extremity edema, orthopnea, or PND.  His blood pressure at home has been mostly in the 120s over 70s.  It very rarely is over 130 systolic.  He is due to have lipids checked with his PCP next month.  The patient does not have symptoms concerning for COVID-19 infection (fever, chills, cough, or new shortness of breath).    Past Medical History:  Diagnosis Date  . Atypical chest pain 10/14/2018  . Essential hypertension 10/14/2018  . Hypertension   . Pulmonary stenosis 06/17/2016  . VSD (ventricular septal defect) 06/17/2016   No past surgical history on file.   Current Meds  Medication Sig  . amLODipine (NORVASC) 2.5 MG tablet Take 1 tablet (2.5 mg total) by mouth daily.  Marland Kitchen. aspirin EC 81 MG tablet Take 81 mg by mouth daily.  Marland Kitchen. atorvastatin (LIPITOR) 10 MG tablet Take 10 mg by mouth as directed. 1/2 tablet daily  . Cholecalciferol (D3-50 PO) Take 50 mcg by mouth daily.  . Coenzyme Q10 (COQ10) 100 MG CAPS Take by mouth.  . magnesium oxide (MAG-OX) 400 MG tablet Take 400 mg by mouth daily.  Allergies:   Patient has no known allergies.   Social History   Tobacco Use  . Smoking status: Never Smoker  . Smokeless tobacco: Never Used  Substance Use Topics  . Alcohol use: Yes  . Drug use: No     Family Hx: The patient's family history includes Hypertension in his father and mother.  ROS:   Please see the history of present illness.    All other systems reviewed and are negative.   Prior CV studies:   The following studies were reviewed today:  Lexiscan Myoview 09/2018:  The left ventricular ejection fraction  is mildly decreased (45-54%).  Nuclear stress EF: 51%. There is mild apical septal wall hypokinesis  There was no ST segment deviation noted during stress. No significant perfusion defects identified.  Overall low risk study with no significant ischemia identified.  Labs/Other Tests and Data Reviewed:    EKG:  An ECG dated 03/04/2019 was personally reviewed today and demonstrated:  Sinus rhythm.  Rate 69 bpm.  Recent Labs: 03/03/2019: BUN 13; Creatinine, Ser 0.85; Hemoglobin 15.0; Platelets 216; Potassium 4.1; Sodium 137   Recent Lipid Panel No results found for: CHOL, TRIG, HDL, CHOLHDL, LDLCALC, LDLDIRECT  Wt Readings from Last 3 Encounters:  08/24/19 156 lb (70.8 kg)  03/03/19 150 lb (68 kg)  10/21/18 152 lb (68.9 kg)     Objective:    Vital Signs:  BP 129/77   Pulse 67   Ht 5\' 7"  (1.702 m)   Wt 156 lb (70.8 kg)   BMI 24.43 kg/m   Gen: sounds well.   Resp: Breathing non-labored. Psych: Normal affect and mood Neuro: Speech fluent  ASSESSMENT & PLAN:    # Atypical chest pain: Symptoms resolved.  Lexiscan Myoview negative 09/2018.   # Pulmonary Stenosis: # VSD:  Stable.  [name] did not have any pulmonary stenosis on his last two echos.  He never required intervention and this likely resolved as he got older.  He still has a small VSD but there is no hemodynamic consequence at this time.  There was no evidence of LV overload or systolic dysfunction.  Endocarditis prophylaxis is not indicated.    # Hypertension: BP well-controlled on amlodipine.   # Hyperlipidemia: Continue atorvastatin.  He will have lipids checked with his PCP next month.    COVID-19 Education: The signs and symptoms of COVID-19 were discussed with the patient and how to seek care for testing (follow up with PCP or arrange E-visit).  The importance of social distancing was discussed today.  Time:   Today, I have spent 11 minutes with the patient with telehealth technology discussing the  above problems.     Medication Adjustments/Labs and Tests Ordered: Current medicines are reviewed at length with the patient today.  Concerns regarding medicines are outlined above.   Tests Ordered: No orders of the defined types were placed in this encounter.   Medication Changes: No orders of the defined types were placed in this encounter.   Follow Up:  In Person in 1 year(s)  Signed, Skeet Latch, MD  08/24/2019 8:14 AM    [facility]

## 2019-10-10 ENCOUNTER — Other Ambulatory Visit: Payer: Self-pay | Admitting: Cardiovascular Disease

## 2020-07-19 ENCOUNTER — Telehealth: Payer: Self-pay | Admitting: *Deleted

## 2020-07-19 NOTE — Telephone Encounter (Signed)
I spoke with Roy Moyer today, he stated he's moving to [facility] and will follow with Dr.Barnwell for a referral. Recall removed.

## 2020-08-21 IMAGING — DX CHEST - 2 VIEW
2 series · 2 of 2 positions shown · non-contrast
Comparison: None.

CLINICAL DATA: Chest pain.  History of ventricular septal defect.

EXAM:
CHEST - 2 VIEW

[chest pa]
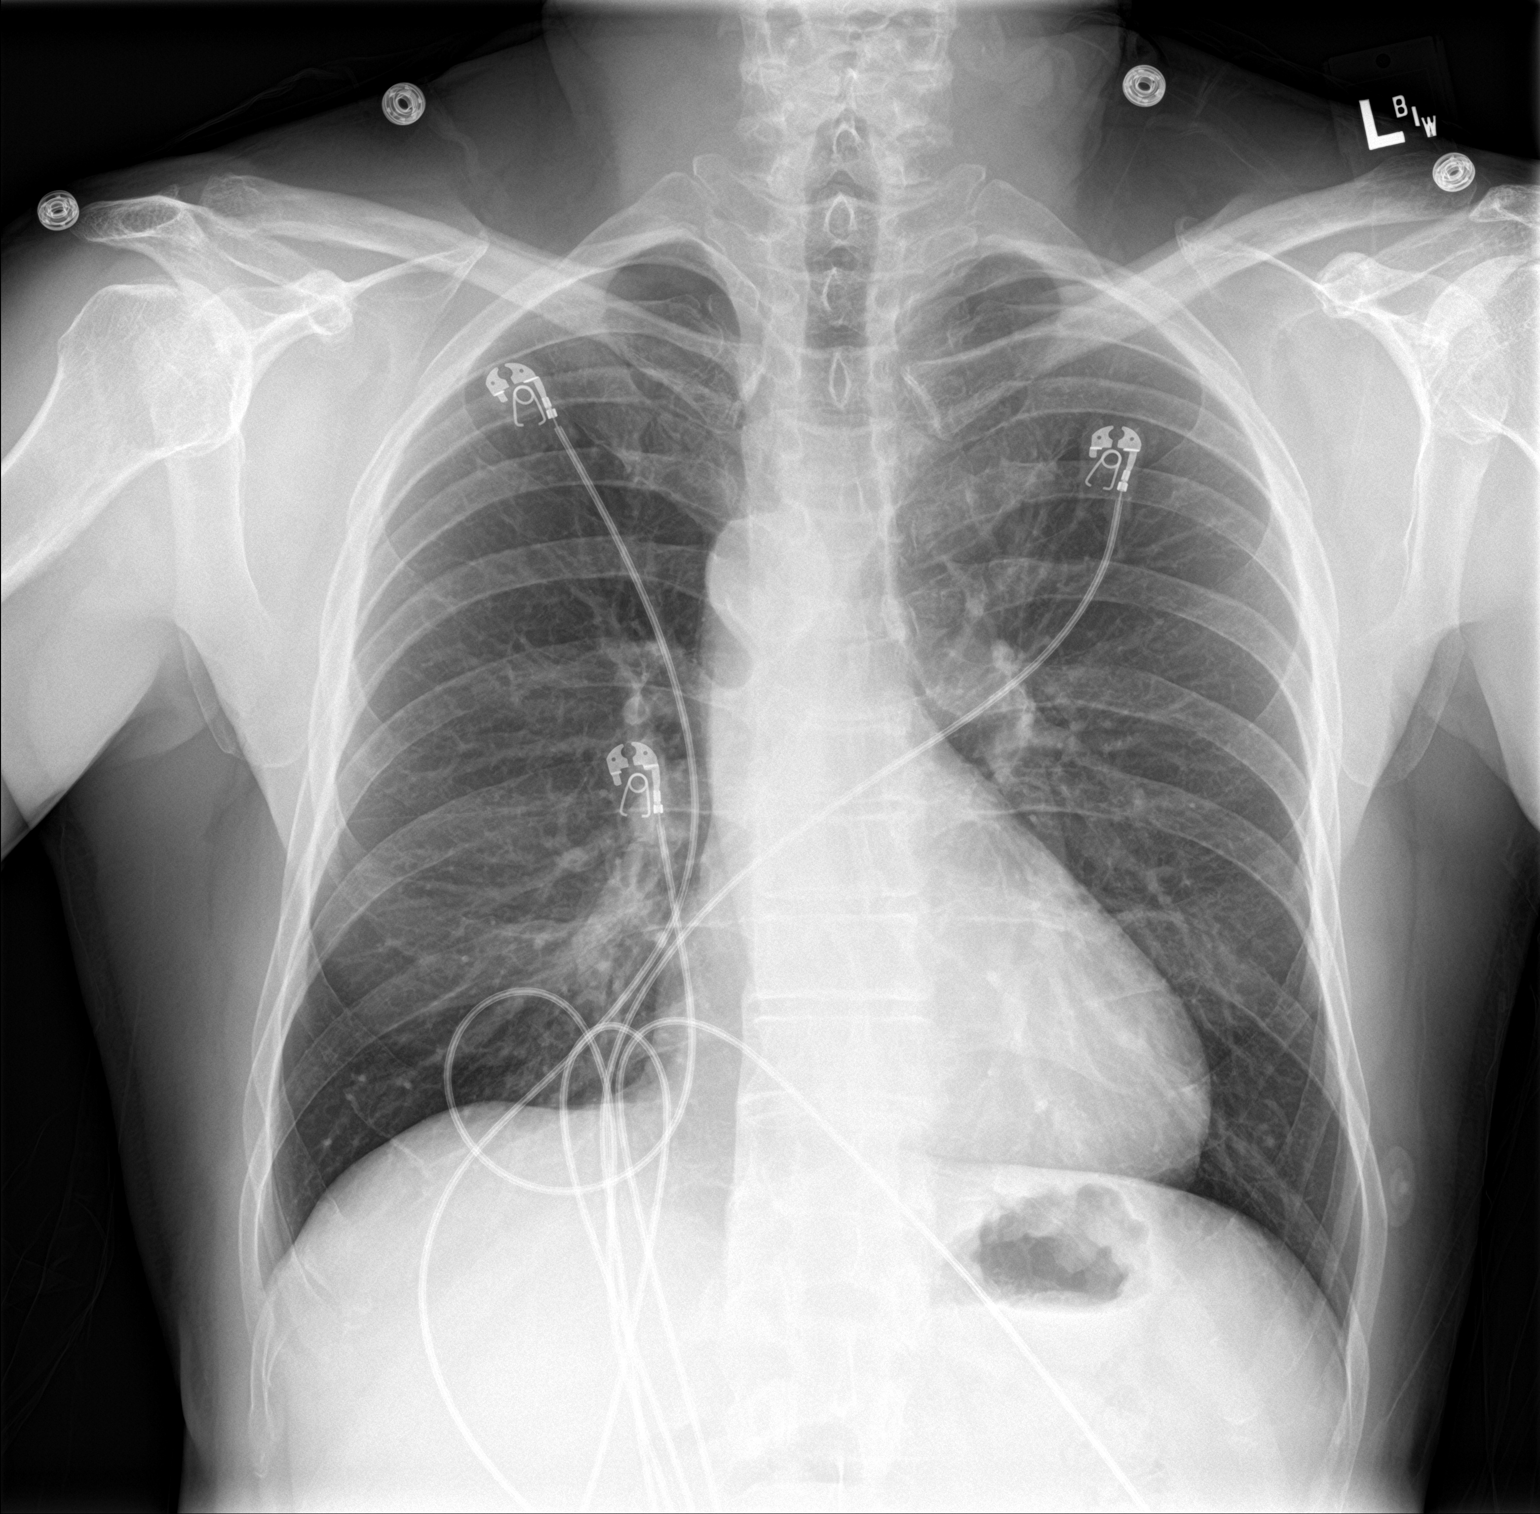

[chest lat]
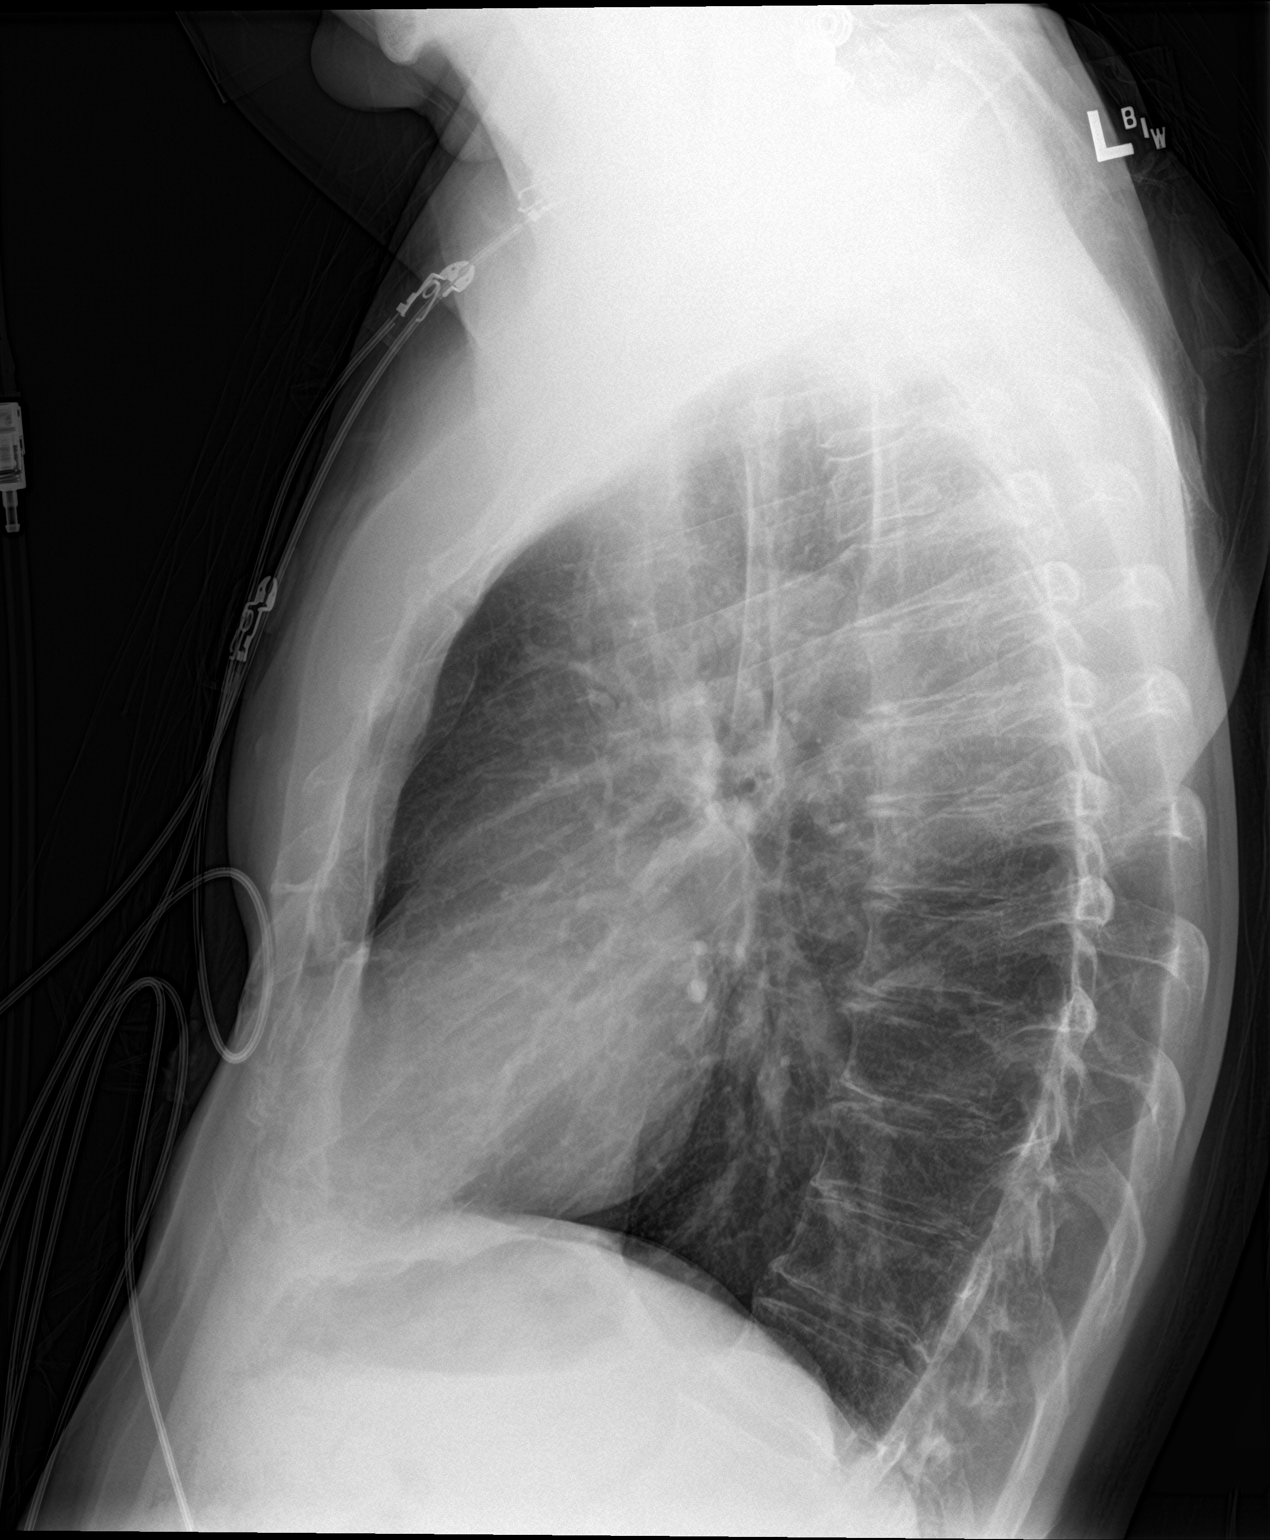

[2 of 2 positions shown; findings below may reference images not displayed]

FINDINGS: Right-sided aortic arch. Overall heart size and pulmonary
vascularity are normal. The lungs are clear. No effusions. No bone
abnormality.
IMPRESSION: No acute abnormalities.  Right-sided aortic arch.
